# Patient Record
Sex: Female | Born: 1955
Health system: Southern US, Community
[De-identification: ages and names within clinical notes are randomized; demographics above are authoritative.]

## PROBLEM LIST (undated history)

## (undated) ENCOUNTER — Inpatient Hospital Stay (HOSPITAL_COMMUNITY): Payer: Self-pay

## (undated) DIAGNOSIS — E119 Type 2 diabetes mellitus without complications: Secondary | ICD-10-CM

## (undated) DIAGNOSIS — E079 Disorder of thyroid, unspecified: Secondary | ICD-10-CM

## (undated) DIAGNOSIS — E7800 Pure hypercholesterolemia, unspecified: Secondary | ICD-10-CM

## (undated) DIAGNOSIS — O99891 Other specified diseases and conditions complicating pregnancy: Secondary | ICD-10-CM

## (undated) DIAGNOSIS — E0590 Thyrotoxicosis, unspecified without thyrotoxic crisis or storm: Secondary | ICD-10-CM

## (undated) DIAGNOSIS — O9989 Other specified diseases and conditions complicating pregnancy, childbirth and the puerperium: Secondary | ICD-10-CM

## (undated) DIAGNOSIS — D259 Leiomyoma of uterus, unspecified: Secondary | ICD-10-CM

## (undated) HISTORY — DX: Type 2 diabetes mellitus without complications: E11.9

## (undated) HISTORY — DX: Leiomyoma of uterus, unspecified: D25.9

## (undated) HISTORY — PX: TUBAL LIGATION: SHX77

## (undated) HISTORY — PX: OTHER SURGICAL HISTORY: SHX169

## (undated) HISTORY — DX: Thyrotoxicosis, unspecified without thyrotoxic crisis or storm: E05.90

## (undated) HISTORY — PX: EYE SURGERY: SHX253

---

## 2001-01-07 ENCOUNTER — Encounter: Admission: RE | Admit: 2001-01-07 | Discharge: 2001-01-07 | Payer: Self-pay | Admitting: Family Medicine

## 2001-01-07 ENCOUNTER — Encounter: Payer: Self-pay | Admitting: Family Medicine

## 2001-03-05 ENCOUNTER — Encounter: Admission: RE | Admit: 2001-03-05 | Discharge: 2001-03-05 | Payer: Self-pay | Admitting: Internal Medicine

## 2004-06-25 ENCOUNTER — Inpatient Hospital Stay (HOSPITAL_COMMUNITY): Admission: AD | Admit: 2004-06-25 | Discharge: 2004-06-25 | Payer: Self-pay | Admitting: *Deleted

## 2004-07-04 ENCOUNTER — Ambulatory Visit: Payer: Self-pay | Admitting: Obstetrics and Gynecology

## 2004-07-04 ENCOUNTER — Encounter: Admission: RE | Admit: 2004-07-04 | Discharge: 2004-07-04 | Payer: Self-pay | Admitting: Obstetrics and Gynecology

## 2004-08-01 ENCOUNTER — Emergency Department (HOSPITAL_COMMUNITY): Admission: EM | Admit: 2004-08-01 | Discharge: 2004-08-01 | Payer: Self-pay | Admitting: Family Medicine

## 2004-08-29 ENCOUNTER — Ambulatory Visit: Payer: Self-pay | Admitting: Obstetrics and Gynecology

## 2004-09-03 ENCOUNTER — Ambulatory Visit: Payer: Self-pay | Admitting: Internal Medicine

## 2004-09-03 ENCOUNTER — Ambulatory Visit (HOSPITAL_COMMUNITY): Admission: RE | Admit: 2004-09-03 | Discharge: 2004-09-03 | Payer: Self-pay | Admitting: Internal Medicine

## 2004-09-16 ENCOUNTER — Ambulatory Visit: Payer: Self-pay | Admitting: Internal Medicine

## 2004-09-18 ENCOUNTER — Ambulatory Visit: Payer: Self-pay | Admitting: Internal Medicine

## 2008-01-31 ENCOUNTER — Inpatient Hospital Stay (HOSPITAL_COMMUNITY): Admission: AD | Admit: 2008-01-31 | Discharge: 2008-01-31 | Payer: Self-pay | Admitting: Obstetrics & Gynecology

## 2008-02-04 ENCOUNTER — Encounter: Payer: Self-pay | Admitting: Internal Medicine

## 2008-02-04 ENCOUNTER — Ambulatory Visit: Payer: Self-pay | Admitting: Internal Medicine

## 2008-02-04 DIAGNOSIS — E119 Type 2 diabetes mellitus without complications: Secondary | ICD-10-CM | POA: Insufficient documentation

## 2008-02-04 DIAGNOSIS — R109 Unspecified abdominal pain: Secondary | ICD-10-CM | POA: Insufficient documentation

## 2008-02-04 DIAGNOSIS — N644 Mastodynia: Secondary | ICD-10-CM

## 2008-02-04 LAB — CONVERTED CEMR LAB
AST: 10 units/L (ref 0–37)
Alkaline Phosphatase: 54 units/L (ref 39–117)
BUN: 18 mg/dL (ref 6–23)
Basophils Relative: 0 % (ref 0–1)
Creatinine, Ser: 0.72 mg/dL (ref 0.40–1.20)
Eosinophils Absolute: 0.3 10*3/uL (ref 0.0–0.7)
Eosinophils Relative: 4 % (ref 0–5)
MCHC: 32.5 g/dL (ref 30.0–36.0)
MCV: 80.7 fL (ref 78.0–100.0)
Monocytes Relative: 7 % (ref 3–12)
Neutrophils Relative %: 60 % (ref 43–77)
Potassium: 4.2 meq/L (ref 3.5–5.3)
RBC: 4.77 M/uL (ref 3.87–5.11)
Total Bilirubin: 0.3 mg/dL (ref 0.3–1.2)

## 2008-02-07 DIAGNOSIS — E785 Hyperlipidemia, unspecified: Secondary | ICD-10-CM | POA: Insufficient documentation

## 2008-02-11 ENCOUNTER — Encounter: Admission: RE | Admit: 2008-02-11 | Discharge: 2008-02-11 | Payer: Self-pay | Admitting: Internal Medicine

## 2008-02-15 ENCOUNTER — Telehealth: Payer: Self-pay | Admitting: *Deleted

## 2008-02-18 ENCOUNTER — Ambulatory Visit: Payer: Self-pay | Admitting: Infectious Diseases

## 2008-02-18 ENCOUNTER — Encounter: Payer: Self-pay | Admitting: Internal Medicine

## 2008-02-18 DIAGNOSIS — R300 Dysuria: Secondary | ICD-10-CM | POA: Insufficient documentation

## 2008-02-18 LAB — CONVERTED CEMR LAB
Creatinine, Urine: 106.7 mg/dL
Microalb Creat Ratio: 6.4 mg/g (ref 0.0–30.0)
Nitrite: NEGATIVE
Specific Gravity, Urine: 1.027 (ref 1.005–1.03)
Urobilinogen, UA: 0.2 (ref 0.0–1.0)

## 2008-02-23 ENCOUNTER — Ambulatory Visit: Payer: Self-pay | Admitting: Obstetrics & Gynecology

## 2008-02-23 ENCOUNTER — Encounter: Payer: Self-pay | Admitting: Obstetrics and Gynecology

## 2008-03-01 ENCOUNTER — Ambulatory Visit (HOSPITAL_COMMUNITY): Admission: RE | Admit: 2008-03-01 | Discharge: 2008-03-01 | Payer: Self-pay | Admitting: Obstetrics & Gynecology

## 2008-03-02 ENCOUNTER — Ambulatory Visit: Payer: Self-pay | Admitting: *Deleted

## 2008-03-02 ENCOUNTER — Encounter: Payer: Self-pay | Admitting: Internal Medicine

## 2008-03-02 LAB — CONVERTED CEMR LAB

## 2008-03-03 LAB — CONVERTED CEMR LAB
Cholesterol: 212 mg/dL — ABNORMAL HIGH (ref 0–200)
Total CHOL/HDL Ratio: 4.8
Triglycerides: 143 mg/dL (ref <150)
VLDL: 29 mg/dL (ref 0–40)

## 2008-03-23 ENCOUNTER — Ambulatory Visit: Payer: Self-pay | Admitting: Obstetrics and Gynecology

## 2008-04-10 ENCOUNTER — Ambulatory Visit: Payer: Self-pay | Admitting: Internal Medicine

## 2008-04-10 ENCOUNTER — Encounter: Payer: Self-pay | Admitting: Internal Medicine

## 2008-04-10 ENCOUNTER — Ambulatory Visit (HOSPITAL_COMMUNITY): Admission: RE | Admit: 2008-04-10 | Discharge: 2008-04-10 | Payer: Self-pay | Admitting: Internal Medicine

## 2008-04-10 DIAGNOSIS — R946 Abnormal results of thyroid function studies: Secondary | ICD-10-CM

## 2008-04-11 ENCOUNTER — Telehealth: Payer: Self-pay | Admitting: Internal Medicine

## 2008-04-13 ENCOUNTER — Telehealth: Payer: Self-pay | Admitting: *Deleted

## 2008-04-18 ENCOUNTER — Ambulatory Visit: Payer: Self-pay | Admitting: Internal Medicine

## 2008-04-25 ENCOUNTER — Ambulatory Visit: Payer: Self-pay | Admitting: Internal Medicine

## 2008-04-25 LAB — CONVERTED CEMR LAB

## 2008-05-16 ENCOUNTER — Encounter (INDEPENDENT_AMBULATORY_CARE_PROVIDER_SITE_OTHER): Payer: Self-pay | Admitting: *Deleted

## 2008-05-16 ENCOUNTER — Ambulatory Visit: Payer: Self-pay | Admitting: Internal Medicine

## 2008-05-16 ENCOUNTER — Encounter: Payer: Self-pay | Admitting: Internal Medicine

## 2008-05-16 DIAGNOSIS — M799 Soft tissue disorder, unspecified: Secondary | ICD-10-CM | POA: Insufficient documentation

## 2008-05-16 DIAGNOSIS — M79609 Pain in unspecified limb: Secondary | ICD-10-CM

## 2008-05-16 LAB — CONVERTED CEMR LAB: Blood Glucose, Fingerstick: 173

## 2008-05-26 ENCOUNTER — Ambulatory Visit: Payer: Self-pay | Admitting: *Deleted

## 2008-05-26 DIAGNOSIS — D259 Leiomyoma of uterus, unspecified: Secondary | ICD-10-CM

## 2008-06-07 ENCOUNTER — Ambulatory Visit: Payer: Self-pay | Admitting: Obstetrics & Gynecology

## 2008-08-16 ENCOUNTER — Telehealth: Payer: Self-pay | Admitting: *Deleted

## 2008-08-17 ENCOUNTER — Telehealth: Payer: Self-pay | Admitting: *Deleted

## 2009-10-09 ENCOUNTER — Encounter: Payer: Self-pay | Admitting: *Deleted

## 2010-08-11 LAB — CONVERTED CEMR LAB
ALT: 12 units/L (ref 0–35)
AST: 11 units/L (ref 0–37)
Alkaline Phosphatase: 65 units/L (ref 39–117)
BUN: 19 mg/dL (ref 6–23)
Basophils Absolute: 0 10*3/uL (ref 0.0–0.1)
Basophils Relative: 0 % (ref 0–1)
Calcium: 9.7 mg/dL (ref 8.4–10.5)
Chloride: 101 meq/L (ref 96–112)
Creatinine, Ser: 0.7 mg/dL (ref 0.40–1.20)
Eosinophils Absolute: 0.2 10*3/uL (ref 0.0–0.7)
Hemoglobin: 13.1 g/dL (ref 12.0–15.0)
Hgb A1c MFr Bld: 9.4 % — ABNORMAL HIGH (ref 4.6–6.1)
MCHC: 33.1 g/dL (ref 30.0–36.0)
MCV: 80.8 fL (ref 78.0–100.0)
Monocytes Absolute: 0.5 10*3/uL (ref 0.1–1.0)
Neutro Abs: 4.4 10*3/uL (ref 1.7–7.7)
Neutrophils Relative %: 60 % (ref 43–77)
RDW: 14 % (ref 11.5–15.5)

## 2010-08-13 NOTE — Letter (Signed)
Summary: 2nd recall letter saved in error//kg  [facility]  [address]   [place], [place] [address]   Phone: [phone]  Fax: [phone]    10/09/2009   Rachel Dougherty [address] [place], [place]  [address]   Dear  Ms. Rich Brave Helbert,  To help [place] provide the highest quality medical care, [facility] uses a sophisticated computer system to track our patient records.  During a review of your record, it appears that you are due for a return visit soon.   Please call our office at your earliest convenience so that we may schedule an appointment.  Thank you,   Appointment Secretary

## 2010-08-13 NOTE — Letter (Signed)
Summary: Recall Letter  [facility]  [address]   [place], [place] [address]   Phone: [phone]  Fax: [phone]    10/09/2009   Rachel Dougherty [address] [place], [place]  [address]   Dear  [name],  To help [place] provide the highest quality medical care, [facility] uses a sophisticated computer system to track our patient records.  During a review of your record, it appears that you are due for a return visit soon.   Please call our office at your earliest convenience so that we may schedule an appointment.  Thank you,  Cynda Familia [facility])  Appended Document: Recall Letter was unable to reach pt by phone above letter mailed

## 2010-11-26 NOTE — Group Therapy Note (Signed)
Rachel Dougherty, Rachel Dougherty                 ACCOUNT NO.:  [id number]   MEDICAL RECORD NO.:  [id number]          PATIENT TYPE:  WOC   LOCATION:  [facility]                   FACILITY:  [facility]   PHYSICIAN:  Caren Griffins, CNM       DATE OF BIRTH:  09/10/1955   DATE OF SERVICE:  02/23/2008                                  CLINIC NOTE   REASON FOR VISIT:  Abdominal pain.   HISTORY OF PRESENT ILLNESS:  This is a 55 year old G6, P 5-0-1-5 who is  sent here following an MAU visit 3 weeks ago for abdominal pain.  At  that visit she had an exam that showed some mild left and right lower  abdominal tenderness and otherwise unremarkable exam.  She has had  chronic lower abdominal pain for many years and was evaluated by Dr.  Okey Dupre at this clinic on August 29, 2004 and her abdominal pelvic pain  was thought to be secondary to pelvic adhesions and possible  adenomyosis.  Of note, she has 5 cesarean sections.  Last intercourse  was 23 years ago.  She has recently returned from [place].  She states  that she was expecting that she could have a hysterectomy after she had  menopause.   Abnormal uterine bleeding has been present for about a year.  Up until  age 80 she had regular periods.  For the past year she describes a  pattern of going sometimes 3 months with no bleeding and then having a  heavy long bleed with clots.  She has also had periods of bleeding up to  3 times a month, typically for about 15 days and 2 other episodes of  bleeding for 2 days.  Her last bleed was February 08, 2008, and she bled for  10 days.  She has worse lower abdominal pain during the bleeding.  Her  past evaluation with [place] was pelvic ultrasound in 2005 where she had a  question of a small endometrial polyp.  She may have had another  ultrasound since then done in [place], and believes that she has fibroids.   She is a type 2 diabetic and not well controlled.  However, she was seen  by [facility] and medications were  adjusted on February 09, 2008.   PHYSICAL EXAM:  CONSTITUTIONAL:  She is in no distress walking.  VITAL SIGNS:  Temperature 97.8, pulse 93, BP 116/72, weight 158.  Abbreviated physical exam.  ABDOMEN:  Abdomen is soft and flat.  She has diffuse lower abdominal  tenderness, no guarding.  No rebound.  She also has vertical and  transverse scarring of her abdomen.  PELVIC EXAM:  External genitalia, mildly atrophic.  She has a female  circumcision.  The introitus admits 2 fingers.  Vagina is also atrophic,  pale with few rugae.  Cervix is posterior without lesions.  Pap smear  done.  There is some scant amount of brown old blood from os during Pap.  Bimanual, the uterus is firm, irregular and enlarged to about 12 weeks'  size and is anteverted.   ASSESSMENT:  Perimenopausal bleeding,  possible fibroids, abdominal pain,  may be due to the adhesions due to having 5 C-sections or she may have  fibroids.   PLAN:  Today will get a CBC, TSH and she will be scheduled for pelvic  ultrasound.  This case is discussed with Dr. Marice Potter.           ______________________________  Caren Griffins, CNM     DP/MEDQ  D:  02/23/2008  T:  02/23/2008  Job:  [id number]

## 2010-11-26 NOTE — Group Therapy Note (Signed)
NAME:  Rachel Dougherty, Rachel Dougherty NO.:  [id number]   MEDICAL RECORD NO.:  [id number]          PATIENT TYPE:  WOC   LOCATION:  [facility]                   FACILITY[facility]  [facility]   PHYSICIAN:  Argentina Donovan, MD        DATE OF BIRTH:  09/10/1955   DATE OF SERVICE:  03/23/2008                                  CLINIC NOTE   The patient is a 55 year old, [place], Arabic-speaking woman who has a  sister here that we delivered a baby for and so easy to communicate with  that way, but we have had her in and talked to her by way of the  translating phone.  Please refer to her chart for more detailed history.  She is gravida 6, para 5-0-1-5, who has had chronic pelvic pain and  heavy bleeding prior to the onset of her menopause, but she still has  __________ 5 cesarean sections and tubal ligation probably have left her  with some marked adhesions in the pelvis, so we are going to schedule  her for a LAVH, possible abdominal hysterectomy.  Ultrasound of the  uterus is normal size and shape with a 5-mm endometrium; no pathology  was noted.  She has returned from [place] especially to have the surgery  and is a diabetic, not well-controlled, and on her last visit, we  checked her thyroid, and she had a TSH of 0.015, so we are starting her  on 100 mcg of Synthroid and referring her to the [facility] for  medical clearance.  I have told her we would definitely not undertake  this surgery if we do not have complete medical clearance and her  diabetes and thyroid under complete control.  She seems to understand  that.  We have also discussed the possibility that she could end up with  severe bowel injury, possible colostomy or urinary tract injury because  of all the scarring that may be present there.  With that in mind,  probably put in ureteral catheters prior to the surgery, although I will  discuss with Dr. Mia Creek.   The diagnosis is chronic persistent pelvic pain.     ______________________________  Argentina Donovan, MD     PR/MEDQ  D:  03/23/2008  T:  03/24/2008  Job:  [id number]

## 2010-11-29 NOTE — Group Therapy Note (Signed)
NAMEDERRIAN, RODAK NO.:  [id number]   MEDICAL RECORD NO.:  [id number]          PATIENT TYPE:  WOC   LOCATION:  [facility]                   [facility]ILITY[facility]  [facility]   PHYSICIAN:  Argentina Donovan, MD        DATE OF BIRTH:  09/10/1955   DATE OF SERVICE:  08/29/2004                                    CLINIC NOTE   This is a 55 year old Arab female gravida 6, para 5-0-1-5 with history of  five cesarean sections who comes in with multiple complaints.  The only GYN  complaint is lower abdominal pain which she has had for many years and  normal ultrasound with marked abdominal scarring vertical and transverse.  The abdomen is soft, flat, tender to palpation without rebound or guarding  and feel that most of her pelvic pain is due to secondary adhesions,  although she may have some adenomyosis as it seems to be increased with her  periods.  Trying to buy time with this diabetic patient because hopefully  the pain will be more tolerable after menopause which should come in several  years.  She is also diabetic.  She has been out of her medication for  several days.  We are going to renew it and try to get her an appointment  with medical clinic at [facility].  Her other complaint is in the  intracostal area along the whole right side and with many areas of point  tenderness.  Mammogram and breast examinations in the past and present have  been fine.  It was suggested anti-inflammatories on a regular basis and  possible use of Capsaicin cream to relieve that pain.   PHYSICAL EXAMINATION:  HEART:  No murmur.  Normal sinus rhythm.  LUNGS:  Clear to auscultation and percussion.  NECK:  Supple with no masses.  HEENT:  Within normal limits.  ABDOMEN:  Soft, flat, tender to deep palpation in the lower abdomen without  rebound or guarding and no organomegaly.  No CVA tenderness.  EXTREMITIES:  Negative.  PELVIC:  External genitalia __________ examination was deferred.  BREASTS:   Symmetric with no dominant masses and no nipple discharge.   IMPRESSION:  1.  Chronic lower abdominal pelvic pain probably secondary to pelvic      adhesions and possible adenomyosis.  2.  Mastodynia with additional probable intracostal neuritis.  3.  Diabetes mellitus  in which the patient has been out of her medication      and feels that she is out of control.  We are going to refer her to the      medical clinic at [facility].      PR/MEDQ  D:  08/29/2004  T:  08/30/2004  Job:  [id number]

## 2010-11-29 NOTE — Group Therapy Note (Signed)
NAMESAMEEHA, ROCKEFELLER NO.:  [id number]   MEDICAL RECORD NO.:  [id number]          PATIENT TYPE:  WOC   [facility]ATION[facility]  [facility]                   [facility]   PHYSICIAN:  Argentina Donovan, MD        DATE OF BIRTH:  09/10/1955   DATE OF SERVICE:  07/04/2004                                    CLINIC NOTE   REASON FOR VISIT:  The patient is a 55 year old Arab female who is a gravida  6 para 5-0-1-5 with history of five cesarean sections who over the past year  has had low abdominal pain, especially when she has a period.  Ultrasound of  her uterus and ovaries were completely normal.  On examination, her abdomen  was soft flat, tender in the area of the cesarean scars, but without  guarding and rebound.  External genitalia is within normal limits.  The  patient has a female circumcision.  The vagina is clean, slight loss of  rugations, with a clean cervix.  The uterus and ovaries could not be well  palpated but the patient did not exhibit much pain while doing this.   We have discussed the possibility of surgery which her, which she was told  in [place] she needed.  I think most of her problem probably is due to  scarring that has occurred from her previous surgeries.  On the other hand,  with the increased pain with her periods, she probably has some adenomyosis,  but being this close to menopause I have tried to dissuade her from desiring  surgery as I do not think she will be happy with the results and get the  dramatic pain relief that she expects.   In addition to this, the patient has some breast tenderness and had a  mammogram today.  The breast tenderness is in the left upper quadrant of the  left breast, and examination in the MAU showed that she had a normal breast  examination.  We will await her results and call her sister who speaks  English with the report.   IMPRESSION:  1.  Mastodynia.  2.  Chronic pelvic pain.      PR/MEDQ  D:  07/04/2004  T:   07/05/2004  Job:  [id number]

## 2011-04-11 LAB — WET PREP, GENITAL
Trich, Wet Prep: NONE SEEN
Yeast Wet Prep HPF POC: NONE SEEN

## 2011-04-11 LAB — URINALYSIS, ROUTINE W REFLEX MICROSCOPIC
Bilirubin Urine: NEGATIVE
Glucose, UA: >1000 — AB
Hgb urine dipstick: NEGATIVE
Ketones, ur: NEGATIVE
Leukocytes, UA: NEGATIVE
Protein, ur: NEGATIVE
pH: 5.5

## 2011-04-11 LAB — POCT PREGNANCY, URINE: Operator id: 251141

## 2011-04-16 ENCOUNTER — Observation Stay (HOSPITAL_COMMUNITY)
Admission: EM | Admit: 2011-04-16 | Discharge: 2011-04-16 | Disposition: A | Discharge status: Home / Self Care | Payer: Self-pay | Attending: Emergency Medicine | Admitting: Emergency Medicine

## 2011-04-16 DIAGNOSIS — E119 Type 2 diabetes mellitus without complications: Principal | ICD-10-CM | POA: Insufficient documentation

## 2011-04-16 DIAGNOSIS — R5381 Other malaise: Secondary | ICD-10-CM | POA: Insufficient documentation

## 2011-04-16 DIAGNOSIS — R300 Dysuria: Secondary | ICD-10-CM | POA: Insufficient documentation

## 2011-04-16 DIAGNOSIS — R5383 Other fatigue: Secondary | ICD-10-CM | POA: Insufficient documentation

## 2011-04-16 DIAGNOSIS — R109 Unspecified abdominal pain: Secondary | ICD-10-CM | POA: Insufficient documentation

## 2011-04-16 LAB — URINALYSIS, ROUTINE W REFLEX MICROSCOPIC
Bilirubin Urine: NEGATIVE
Glucose, UA: >1000 mg/dL — AB
Ketones, ur: NEGATIVE mg/dL
Nitrite: NEGATIVE
Protein, ur: NEGATIVE mg/dL
pH: 6.5 (ref 5.0–8.0)

## 2011-04-16 LAB — POCT I-STAT, CHEM 8
Calcium, Ion: 1.24 mmol/L (ref 1.12–1.32)
Calcium, Ion: 1.24 mmol/L (ref 1.12–1.32)
Chloride: 104 mEq/L (ref 96–112)
Glucose, Bld: 423 mg/dL — ABNORMAL HIGH (ref 70–99)
Glucose, Bld: 221 mg/dL — ABNORMAL HIGH (ref 70–99)
HCT: 37 % (ref 36.0–46.0)
HCT: 35 % — ABNORMAL LOW (ref 36.0–46.0)
Hemoglobin: 11.9 g/dL — ABNORMAL LOW (ref 12.0–15.0)
TCO2: 23 mmol/L (ref 0–100)

## 2011-04-16 LAB — GLUCOSE, CAPILLARY
Glucose-Capillary: 407 mg/dL — ABNORMAL HIGH (ref 70–99)
Glucose-Capillary: 306 mg/dL — ABNORMAL HIGH (ref 70–99)
Glucose-Capillary: 254 mg/dL — ABNORMAL HIGH (ref 70–99)
Glucose-Capillary: 250 mg/dL — ABNORMAL HIGH (ref 70–99)
Glucose-Capillary: 222 mg/dL — ABNORMAL HIGH (ref 70–99)

## 2011-04-16 LAB — URINE MICROSCOPIC-ADD ON

## 2011-04-17 LAB — URINE CULTURE

## 2011-04-23 ENCOUNTER — Inpatient Hospital Stay (INDEPENDENT_AMBULATORY_CARE_PROVIDER_SITE_OTHER)
Admission: RE | Admit: 2011-04-23 | Discharge: 2011-04-23 | Disposition: A | Discharge status: Home / Self Care | Payer: Self-pay | Source: Ambulatory Visit | Attending: Family Medicine | Admitting: Family Medicine

## 2011-04-23 DIAGNOSIS — M7710 Lateral epicondylitis, unspecified elbow: Secondary | ICD-10-CM

## 2011-04-23 DIAGNOSIS — E119 Type 2 diabetes mellitus without complications: Secondary | ICD-10-CM

## 2011-04-23 DIAGNOSIS — R51 Headache: Secondary | ICD-10-CM

## 2011-05-12 ENCOUNTER — Inpatient Hospital Stay (INDEPENDENT_AMBULATORY_CARE_PROVIDER_SITE_OTHER)
Admission: RE | Admit: 2011-05-12 | Discharge: 2011-05-12 | Disposition: A | Discharge status: Home / Self Care | Payer: Self-pay | Source: Ambulatory Visit | Attending: Emergency Medicine | Admitting: Emergency Medicine

## 2011-05-12 DIAGNOSIS — E119 Type 2 diabetes mellitus without complications: Secondary | ICD-10-CM

## 2011-05-12 LAB — GLUCOSE, CAPILLARY: Glucose-Capillary: 311 mg/dL — ABNORMAL HIGH (ref 70–99)

## 2011-05-22 ENCOUNTER — Ambulatory Visit (INDEPENDENT_AMBULATORY_CARE_PROVIDER_SITE_OTHER): Payer: Self-pay | Admitting: Ophthalmology

## 2011-05-22 ENCOUNTER — Encounter: Payer: Self-pay | Admitting: Ophthalmology

## 2011-05-22 VITALS — BP 120/74 | HR 79 | Temp 96.5°F | Wt 160.6 lb

## 2011-05-22 DIAGNOSIS — R946 Abnormal results of thyroid function studies: Secondary | ICD-10-CM

## 2011-05-22 DIAGNOSIS — E0590 Thyrotoxicosis, unspecified without thyrotoxic crisis or storm: Secondary | ICD-10-CM

## 2011-05-22 DIAGNOSIS — E785 Hyperlipidemia, unspecified: Secondary | ICD-10-CM

## 2011-05-22 DIAGNOSIS — E119 Type 2 diabetes mellitus without complications: Secondary | ICD-10-CM

## 2011-05-22 DIAGNOSIS — R109 Unspecified abdominal pain: Secondary | ICD-10-CM

## 2011-05-22 LAB — GLUCOSE, CAPILLARY: Glucose-Capillary: 163 mg/dL — ABNORMAL HIGH (ref 70–99)

## 2011-05-22 LAB — LIPID PANEL
LDL Cholesterol: 180 mg/dL — ABNORMAL HIGH (ref 0–99)
Total CHOL/HDL Ratio: 6.3 Ratio
VLDL: 33 mg/dL (ref 0–40)

## 2011-05-22 LAB — POCT GLYCOSYLATED HEMOGLOBIN (HGB A1C): Hemoglobin A1C: 12

## 2011-05-22 MED ORDER — INSULIN NPH ISOPHANE & REGULAR (70-30) 100 UNIT/ML ~~LOC~~ SUSP: SUBCUTANEOUS | Status: DC

## 2011-05-22 MED ORDER — INSULIN ISOPHANE & REGULAR (50-50) 100 UNIT/ML ~~LOC~~ SUSP: SUBCUTANEOUS | Status: DC

## 2011-05-22 MED ORDER — ASPIRIN EC 81 MG PO TBEC: 81.0000 mg | DELAYED_RELEASE_TABLET | Freq: Every day | ORAL | Status: DC

## 2011-05-22 MED ORDER — PRAVASTATIN SODIUM 20 MG PO TABS: 20.0000 mg | ORAL_TABLET | Freq: Every evening | ORAL | Status: DC

## 2011-05-22 NOTE — Assessment & Plan Note (Signed)
Drew TSH today. Patient seems to be symptomatically hyperthyroid. Will consider medical treatment at next visit.

## 2011-05-22 NOTE — Patient Instructions (Signed)
Please monitor blood sugar twice a day for next two weeks and bring in monitor. Ask to make appt with diabetes educator. Call us if she is having sugars below 70 or feeling weak, shaky or nauseous.

## 2011-05-22 NOTE — Assessment & Plan Note (Signed)
Will increase 70/30 to 30 units in AM and 18 units at night. Advised patient to call if she is having symptoms of hypoglycemia. Will also meet with Lupita Leash Plyler to refresh her lifestyle modification plan. Will not use metformin due to history of diarrhea with its use.

## 2011-05-22 NOTE — Assessment & Plan Note (Signed)
Patient has not been taking statin for more than 1 year. Counseled her that she is at risk for a heart attack or stroke because of her diabetes and high cholesterol and she will need to take a baby aspirin and cholesterol medication long term to decrease her risk. Drew non-fasting lipid panel today and started her on pravastatin. May need to increase dose.

## 2011-05-22 NOTE — Progress Notes (Signed)
Subjective:   Patient ID: Rachel Dougherty female   DOB: 09/10/1955 55 y.o.   MRN: [id number]  HPI: [name] is a 55 y.o. woman with a history of uterine fibroids, c-sections and abdominal pain who presents for return to primary care. Patient had orange card in the past but is now expired. Patient does not speak English and was interviewed with the aid of her sister, Rachel Dougherty and an Investment banker, corporate.  Abdominal Pain-  Has had pain for the last 3-4 years but has increased in past year. Pain is 7/8 out of 10. Pain comes and goes, about 2-3 days per week and occurs with associated low back pain. Feels pain on right middle abdomen and it radiates to periumbilical area. Has occasional constipation, was diagnosed with IBS in [place]. Normally moves bowels once a day but sometimes as infrequently as once every 3 days. Doesn't  take anything for irritable bowel. Has more pain after eating. Also complains of gas. Was referred to Dr. Okey Dupre of gynecology in 2009 and he planned to do hysterectomy but per patient he wanted to wait until she was post-menopausal. She has not had menses in past 1.5 years.  Diabetes- didn't take insulin for 1 week after arrival back in [place] states in late September, until she presented to emergency room and got prescription on 04/16/11. She is currently taking 23U in AM/15 Units PM. Was taking 30 units in the morning in the past. Patient brought in her glucometer today and her sugars are about 200 in the AM and around 300 in evening. Breakfast - milk/tea and crackers at 8am, Lunch around noon and dinner aound 4-5pm. No rice or potatoes or sweets, about 2 pita breads per day, whole wheat. Uses splenda in her tea and has sugar free drinks similar to crystal light. Also has corn bread occasionally. Has seen our diabetes educator in the past. Used to take metformin 1000mg  as well, although she says that this gave her diarrhea all the time. Knows symptoms of hypoglycemia, hasn't had any episodes  in past 4-5 months. Episodes occurred in setting of not having breakfast.  Thyroid- was on medication for hyperthyroid prescribed in [place], hasn't taken in past 5-6 months. Feels subjectively hot, has some symptoms of shaking. Reports hair loss and weight loss recently. Also sweatiness. Feels like heart is racing occasionally. Weight is essentially stable from visits in 2009- all visits fall between 160 and 165 lbs.  Cataract- diagnosed in [place]  Past Medical History  Diagnosis Date  . Diabetes mellitus type II   . Hyperthyroidism   . Fibroid uterus    Current Outpatient Prescriptions  Medication Sig Dispense Refill  . insulin NPH-insulin regular (NOVOLIN 50/50) (50-50) 100 UNIT/ML injection 30 Units before breakfast and 18 units before dinner  10 mL  4  . aspirin EC 81 MG tablet Take 1 tablet (81 mg total) by mouth daily.  150 tablet  2  . pravastatin (PRAVACHOL) 20 MG tablet Take 1 tablet (20 mg total) by mouth every evening.  30 tablet  11   No family history on file. History   Social History  . Marital Status: Married    Spouse Name: N/A    Number of Children: N/A  . Years of Education: N/A   Social History Main Topics  . Smoking status: Never Smoker   . Smokeless tobacco: Not on file  . Alcohol Use: No  . Drug Use: No  . Sexually Active: Not on file  Other Topics Concern  . Not on file   Social History Narrative  . No narrative on file   Review of Systems: HEENT: Reports blurry vision. Respiratory: Denies SOB. Cardiovascular: Denies chest painand leg swelling. Reports palpitations  Gastrointestinal: Reports abdominal pain and constipation   Objective:  Physical Exam: Filed Vitals:   05/22/11 0938  BP: 120/74  Pulse: 79  Temp: 96.5 F (35.8 C)  TempSrc: Oral  Weight: 160 lb 9.6 oz (72.848 kg)   Constitutional: Vital signs reviewed.  Patient is an obese veiled [place] woman with her sister and nephew Eyes: PERRL, EOMI, conjunctivae normal, No scleral  icterus.  Cardiovascular: RRR, S1 normal, S2 normal, no MRG, pulses symmetric and intact bilaterally Pulmonary/Chest: CTAB, no wheezes, rales, or rhonchi Abdominal: Soft. Mildly tender to palpation of right abdomen, non-distended, bowel sounds are normal. GU: no CVA tenderness Musculoskeletal: No joint deformities, erythema, or stiffness, ROM full and no nontender  Skin: Warm, dry and intact. No rash, cyanosis, or clubbing.  Psychiatric: Normal mood and affect. speech and behavior is normal. Judgment and thought content normal. Cognition and memory are normal.   Assessment & Plan:

## 2011-05-22 NOTE — Assessment & Plan Note (Signed)
Referred back to Dr. Okey Dupre of gynecology since he has seen her in the past for hysterectomy. Patient may have other causes of her abdominal pain since she complains of post-prandial pain and gas.

## 2011-05-23 ENCOUNTER — Telehealth: Payer: Self-pay | Admitting: *Deleted

## 2011-05-23 LAB — MICROALBUMIN / CREATININE URINE RATIO: Microalb, Ur: 0.5 mg/dL (ref 0.00–1.89)

## 2011-05-23 LAB — TSH: TSH: 0.754 u[IU]/mL (ref 0.350–4.500)

## 2011-05-23 NOTE — Telephone Encounter (Signed)
Family member called about rx that had not been called to [facility]; rx for pt.'s thyroid I talked to Dr. Maisie Fus; lab is normal and pt does not need medication at this time; will continue to monitor. I inform her of this; she understood.

## 2011-05-23 NOTE — Progress Notes (Signed)
Addended by: Angelina Ok F on: 05/23/2011 10:24 AM   Modules accepted: Orders

## 2011-06-02 ENCOUNTER — Encounter (HOSPITAL_COMMUNITY): Payer: Self-pay | Admitting: *Deleted

## 2011-06-02 ENCOUNTER — Inpatient Hospital Stay (HOSPITAL_COMMUNITY)
Admission: AD | Admit: 2011-06-02 | Discharge: 2011-06-02 | Disposition: A | Discharge status: Home / Self Care | Payer: Self-pay | Source: Ambulatory Visit | Attending: Obstetrics & Gynecology | Admitting: Obstetrics & Gynecology

## 2011-06-02 ENCOUNTER — Inpatient Hospital Stay (HOSPITAL_COMMUNITY): Payer: Self-pay

## 2011-06-02 DIAGNOSIS — Z780 Asymptomatic menopausal state: Secondary | ICD-10-CM | POA: Insufficient documentation

## 2011-06-02 DIAGNOSIS — R109 Unspecified abdominal pain: Secondary | ICD-10-CM | POA: Insufficient documentation

## 2011-06-02 DIAGNOSIS — D259 Leiomyoma of uterus, unspecified: Secondary | ICD-10-CM | POA: Insufficient documentation

## 2011-06-02 DIAGNOSIS — E785 Hyperlipidemia, unspecified: Secondary | ICD-10-CM

## 2011-06-02 HISTORY — DX: Pure hypercholesterolemia, unspecified: E78.00

## 2011-06-02 HISTORY — DX: Other specified diseases and conditions complicating pregnancy: O99.891

## 2011-06-02 HISTORY — DX: Other specified diseases and conditions complicating pregnancy, childbirth and the puerperium: O99.89

## 2011-06-02 LAB — DIFFERENTIAL
Basophils Absolute: 0 10*3/uL (ref 0.0–0.1)
Basophils Relative: 1 % (ref 0–1)
Lymphocytes Relative: 35 % (ref 12–46)
Neutro Abs: 3.1 10*3/uL (ref 1.7–7.7)
Neutrophils Relative %: 54 % (ref 43–77)

## 2011-06-02 LAB — CBC
HCT: 40.3 % (ref 36.0–46.0)
Platelets: 279 10*3/uL (ref 150–400)
RDW: 13.4 % (ref 11.5–15.5)
WBC: 5.7 10*3/uL (ref 4.0–10.5)

## 2011-06-02 LAB — URINALYSIS, ROUTINE W REFLEX MICROSCOPIC
Leukocytes, UA: NEGATIVE
Nitrite: NEGATIVE
Specific Gravity, Urine: >1.03 — ABNORMAL HIGH (ref 1.005–1.030)
Urobilinogen, UA: 0.2 mg/dL (ref 0.0–1.0)
pH: 5 (ref 5.0–8.0)

## 2011-06-02 MED ORDER — OXYCODONE-ACETAMINOPHEN 5-325 MG PO TABS
1.0000 | ORAL_TABLET | Freq: Once | ORAL | Status: AC
  Administered 2011-06-02: 1 via ORAL
  Filled 2011-06-02: qty 1

## 2011-06-02 NOTE — ED Provider Notes (Signed)
History     Chief Complaint  Patient presents with  . Abdominal Pain   HPINemat E Blomquist 55 y.o. presents with abdominal pain.  Former patient of Dr. Erich Montane' last seen several years ago.  Plan at that time was a hysterectomy for ? Fibroids.  Went back to [place], did not have surgery.  She was fearful of surgery per the interpreter.   Last menstrual cycle 1 year ago. Denies vaginal bleeding.  Has appt in the GYN CLINIC for 12/19 but did not feel she could wait because of pain.  Reports pain is in the lower abdomen.  Also has this pain when she urinates.  Denies other UTI sxs.  Rates pain as 7/10.  Allergic to Penicillin.  She has not taken anything for the pain.  Denies nausea and vomiting.  Not sexually active for years, husband is deceased.   Past Medical History  Diagnosis Date  . Diabetes mellitus type II   . Hyperthyroidism   . Fibroid uterus   . Hypercholesteremia   . Maternal blood transfusion     after miscarriage    Past Surgical History  Procedure Date  . Repeat cesarean section     5 C-section  . Tubal ligation   . Cesarean section   . Female circumcision     History reviewed. No pertinent family history.  History  Substance Use Topics  . Smoking status: Never Smoker   . Smokeless tobacco: Not on file  . Alcohol Use: No    Allergies:  Allergies  Allergen Reactions  . Penicillins Rash    Prescriptions prior to admission  Medication Sig Dispense Refill  . aspirin EC 81 MG tablet Take 81 mg by mouth daily.        . insulin NPH-insulin regular (NOVOLIN 70/30) (70-30) 100 UNIT/ML injection Inject 18-30 Units into the skin 2 (two) times daily with a meal. Injects 30 units before breakfast & 18 units before dinner       . pravastatin (PRAVACHOL) 20 MG tablet Take 20 mg by mouth every evening.        Marland Kitchen DISCONTD: aspirin EC 81 MG tablet Take 1 tablet (81 mg total) by mouth daily.  150 tablet  2  . DISCONTD: pravastatin (PRAVACHOL) 20 MG tablet Take 1 tablet (20 mg  total) by mouth every evening.  30 tablet  11    Review of Systems  Constitutional: Negative for fever.  HENT: Negative.   Cardiovascular: Negative.   Gastrointestinal: Positive for abdominal pain (across lower abdomen). Negative for nausea and vomiting.  Genitourinary: Positive for dysuria. Negative for urgency, frequency and hematuria.  Musculoskeletal: Positive for back pain (lower back bialterally).   Physical Exam   Blood pressure 119/81, pulse 77, temperature 98.9 F (37.2 C), temperature source Oral, resp. rate 16, last menstrual period 05/21/2010, SpO2 99.00%.  Physical Exam  Constitutional: She is oriented to person, place, and time. She appears well-developed and well-nourished. No distress.  HENT:  Head: Normocephalic.  Neck: Normal range of motion.  Cardiovascular: Normal rate.   Respiratory: Effort normal.  GI: Soft. She exhibits no distension and no mass. There is no tenderness. There is no rebound and no guarding.  Genitourinary: Uterus is enlarged (irregular ) and tender. Right adnexum displays no mass, no tenderness and no fullness. Left adnexum displays no mass, no tenderness and no fullness. No tenderness or bleeding around the vagina. No vaginal discharge found.       Female circumcision  Neurological: She is  alert and oriented to person, place, and time.  Skin: Skin is warm and dry.   Results for orders placed during the hospital encounter of 06/02/11 (from the past 24 hour(s))  URINALYSIS, ROUTINE W REFLEX MICROSCOPIC     Status: Abnormal   Collection Time   06/02/11  1:20 PM      Component Value Range   Color, Urine YELLOW  YELLOW    Appearance CLEAR  CLEAR    Specific Gravity, Urine >1.030 (*) 1.005 - 1.030    pH 5.0  5.0 - 8.0    Glucose, UA NEGATIVE  NEGATIVE (mg/dL)   Hgb urine dipstick NEGATIVE  NEGATIVE    Bilirubin Urine NEGATIVE  NEGATIVE    Ketones, ur 15 (*) NEGATIVE (mg/dL)   Protein, ur NEGATIVE  NEGATIVE (mg/dL)   Urobilinogen, UA 0.2   0.0 - 1.0 (mg/dL)   Nitrite NEGATIVE  NEGATIVE    Leukocytes, UA NEGATIVE  NEGATIVE   CBC     Status: Normal   Collection Time   06/02/11  2:06 PM      Component Value Range   WBC 5.7  4.0 - 10.5 (K/uL)   RBC 4.87  3.87 - 5.11 (MIL/uL)   Hemoglobin 13.2  12.0 - 15.0 (g/dL)   HCT 16.1  09.6 - 04.5 (%)   MCV 82.8  78.0 - 100.0 (fL)   MCH 27.1  26.0 - 34.0 (pg)   MCHC 32.8  30.0 - 36.0 (g/dL)   RDW 40.9  81.1 - 91.4 (%)   Platelets 279  150 - 400 (K/uL)  DIFFERENTIAL     Status: Normal   Collection Time   06/02/11  2:06 PM      Component Value Range   Neutrophils Relative 54  43 - 77 (%)   Neutro Abs 3.1  1.7 - 7.7 (K/uL)   Lymphocytes Relative 35  12 - 46 (%)   Lymphs Abs 2.0  0.7 - 4.0 (K/uL)   Monocytes Relative 7  3 - 12 (%)   Monocytes Absolute 0.4  0.1 - 1.0 (K/uL)   Eosinophils Relative 3  0 - 5 (%)   Eosinophils Absolute 0.2  0.0 - 0.7 (K/uL)   Basophils Relative 1  0 - 1 (%)   Basophils Absolute 0.0  0.0 - 0.1 (K/uL)        *RADIOLOGY REPORT*  Clinical Data: Pelvic pain. Fibroids. Post menopausal female with  LMP 1 year ago.  TRANSABDOMINAL AND TRANSVAGINAL ULTRASOUND OF PELVIS  Technique: Both transabdominal and transvaginal ultrasound  examinations of the pelvis were performed. Transabdominal  technique was performed for global imaging of the pelvis including  uterus, ovaries, adnexal regions, and pelvic cul-de-sac.  It was necessary to proceed with endovaginal exam following the  transabdominal exam to visualize the endometrium and ovaries.  Comparison: None.  Findings:  Uterus: 8.5 x 3.0 x 2.8 cm. Retroflexed. A single small  intramural fibroid is seen in the left posterior fundus measuring  1.3 cm in maximum diameter.  Endometrium: Double layer thickness measures 6 mm transvaginally.  Normal appearance.  Right ovary: 2.2 x 1.5 x 1.5 cm. Normal appearance.  Left ovary: 2.9 x 1.6 x 1.7 cm. Normal appearance.  Other Findings: No free fluid  IMPRESSION:    1. Retroflexed uterus with 1.3 cm left posterior fundal fibroid.  2. Endometrial thickness of 6 mm is within normal limits in the  absence of post menopausal bleeding.  3. Normal post menopausal size and appearance of both  ovaries.  Original Report Authenticated By: Danae Orleans, M.D.    MAU Course  Procedures  MDM  Percocet 5/325mg  Tab given in MAU/   Assessment and Plan  A:  Abdominal Pain/post menopausal      1.3cm posterior fundal fibroid  P: Encouraged to keep the appointment she has scheduled in the GYN CLINIC     May use Advil at home for pain.     If pain worsens after treatment, will need re-evaluation  Madaleine Simmon,EVE M 06/02/2011, 1:49 PM   Matt Holmes, NP 06/02/11 1642

## 2011-06-02 NOTE — Progress Notes (Signed)
Patient states she has not had periods in about one year. Has been a patient of Dr. Okey Dupre and had planned to remove the uterus when her periods stopped but she left the country. Now that she is back she is having a lot of abdominal pain. Has an appointment on 12-19 but is having too much pain.

## 2011-06-09 ENCOUNTER — Ambulatory Visit (INDEPENDENT_AMBULATORY_CARE_PROVIDER_SITE_OTHER): Payer: Self-pay | Admitting: Ophthalmology

## 2011-06-09 ENCOUNTER — Encounter: Payer: Self-pay | Admitting: Ophthalmology

## 2011-06-09 DIAGNOSIS — E119 Type 2 diabetes mellitus without complications: Secondary | ICD-10-CM

## 2011-06-09 DIAGNOSIS — R946 Abnormal results of thyroid function studies: Secondary | ICD-10-CM

## 2011-06-09 DIAGNOSIS — E1149 Type 2 diabetes mellitus with other diabetic neurological complication: Secondary | ICD-10-CM

## 2011-06-09 DIAGNOSIS — G909 Disorder of the autonomic nervous system, unspecified: Secondary | ICD-10-CM

## 2011-06-09 DIAGNOSIS — E1143 Type 2 diabetes mellitus with diabetic autonomic (poly)neuropathy: Secondary | ICD-10-CM

## 2011-06-09 DIAGNOSIS — E1142 Type 2 diabetes mellitus with diabetic polyneuropathy: Secondary | ICD-10-CM | POA: Insufficient documentation

## 2011-06-09 LAB — GLUCOSE, CAPILLARY: Glucose-Capillary: 301 mg/dL — ABNORMAL HIGH (ref 70–99)

## 2011-06-09 MED ORDER — INSULIN NPH ISOPHANE & REGULAR (70-30) 100 UNIT/ML ~~LOC~~ SUSP: SUBCUTANEOUS | Status: DC

## 2011-06-09 MED ORDER — TRAMADOL HCL 50 MG PO TABS: 50.0000 mg | ORAL_TABLET | Freq: Every evening | ORAL | Status: AC | PRN

## 2011-06-09 NOTE — Assessment & Plan Note (Signed)
Patient had not increased her dose of 70/30 as directed (30 in AM, 18 at night) so we will make these changes today and have her come back in 2 weeks for further changes in her diabetic regimen. Also made appt for her to see diabetic educator. She denies having excessive sugary drinks, breads, rice, potatoes, claims to eat only 2-3 small whole wheat pita breads each day. Patient also inquires about metformin at the end of visit. She had taken it in the past but she described having diarrhea so I did not start her on it at her last visit. Can discuss at next visit.

## 2011-06-09 NOTE — Patient Instructions (Signed)
Please take 30 Units in the morning and 18 units at night.

## 2011-06-09 NOTE — Progress Notes (Signed)
  Subjective:   Patient ID: Rachel Dougherty female   DOB: 09/10/1955 55 y.o.   MRN: [id number]  HPI: [name] is a 55 y.o. woman with poorly controlled diabetes who presents with headache and numbness/pain of her fingers and arms.  Headache: started 5 days ago, not everyday, up to 2 hours. Not taking anything for headache.   DM: She didn't get call to arrange appt with diabetes educator, appears that she wasn't referred. Always takes her insulin, is taking 25 units in AM, is taking 15 units at night or 18 units, if her blood sugar is greater than 175.   Peripheral Pain: patient has pain in her hands and arms mostly at night which makes it difficult to sleep. Also has symptoms in toes though doesn't complain of this. Complains of numbness as well.   Throat pain: when she get upset or angry, have pain in throat and loses voice. No pain or difficulty with swallowing.   Past Medical History  Diagnosis Date  . Diabetes mellitus type II   . Hyperthyroidism   . Fibroid uterus   . Hypercholesteremia   . Maternal blood transfusion     after miscarriage   Current Outpatient Prescriptions  Medication Sig Dispense Refill  . aspirin EC 81 MG tablet Take 81 mg by mouth daily.        . pravastatin (PRAVACHOL) 20 MG tablet Take 20 mg by mouth every evening.        . insulin NPH-insulin regular (HUMULIN 70/30 PEN) (70-30) 100 UNIT/ML injection 30 units in the morning, and 18 units in the evening.  3 mL  12  . traMADol (ULTRAM) 50 MG tablet Take 1 tablet (50 mg total) by mouth at bedtime as needed for pain. Maximum dose= 8 tablets per day  30 tablet  1   No family history on file. History   Social History  . Marital Status: Married    Spouse Name: N/A    Number of Children: N/A  . Years of Education: N/A   Social History Main Topics  . Smoking status: Never Smoker   . Smokeless tobacco: None  . Alcohol Use: No  . Drug Use: No  . Sexually Active: None   Other Topics Concern  . None     Social History Narrative   Had been a patient of the clinic in 2009 then moved back to [place]. Has returned to the [place] as of November 2012.   Objective:  Physical Exam: Filed Vitals:   06/09/11 1023  BP: 107/65  Pulse: 84  Temp: 97.7 F (36.5 C)  TempSrc: Oral  Weight: 162 lb 3.2 oz (73.573 kg)   General: veiled elderly woman with her sister and nephew and interpreter HEENT: PERRL, EOMI, no scleral icterus Cardiac: RRR, no rubs, murmurs or gallops Pulm: clear to auscultation bilaterally, moving normal volumes of air Neuro: alert and oriented X3, cranial nerves II-XII grossly intact Assessment & Plan:

## 2011-06-09 NOTE — Assessment & Plan Note (Signed)
Explained to patient and her sister Rachel Dougherty that her thyroid levels are now normal and she does not need medication for hyperthyroidism currently.

## 2011-06-09 NOTE — Assessment & Plan Note (Signed)
Patient describes numbness and pain which is likely secondary to peripheral neuropathy. Prescribed tramadol 50mg  QHS and can see if this improves her pain since she is only interested in a medication at night. Can consider gabapentin at next visit.

## 2011-06-10 ENCOUNTER — Telehealth: Payer: Self-pay | Admitting: *Deleted

## 2011-06-10 NOTE — Telephone Encounter (Signed)
Got a call back from MAP- patient was informed in Arabic that she had to apply for MAP ( come back with all the required paperwork) then return if accepted to meet with a pharmacist) prior to being able to get free medicines - ie insulin pens. She received vials from the pharmacy that she can use until she is able to get the pens ordered.   I will reinforce this plan as needed at patient's appointment with me.

## 2011-06-10 NOTE — Telephone Encounter (Signed)
Rachel Dougherty,  Patient wanted insulin pens instead of vials, [place] called to say she needs to have MAPP program to get pens. Can you address this at your visit with her?

## 2011-06-10 NOTE — Telephone Encounter (Signed)
Usually [facility] pharmacy sends patient to MAP program across the hall. I wonder if they didn't do this because of a problem with paperwork/citizenship. Called MAP and left message.

## 2011-06-10 NOTE — Telephone Encounter (Signed)
Call from [facility], [place]- questions about Insulin prescription.  Will need to be vials as they do not carry the Insulin Pens.  Pt will also need to sign up for MAPP to get the pens. Angelina Ok, RN 06/10/2011 12:23 PM

## 2011-06-30 ENCOUNTER — Encounter: Payer: Self-pay | Admitting: Dietician

## 2011-06-30 ENCOUNTER — Other Ambulatory Visit: Payer: Self-pay | Admitting: Internal Medicine

## 2011-06-30 ENCOUNTER — Ambulatory Visit (INDEPENDENT_AMBULATORY_CARE_PROVIDER_SITE_OTHER): Payer: Self-pay | Admitting: Dietician

## 2011-06-30 ENCOUNTER — Encounter: Payer: Self-pay | Admitting: Internal Medicine

## 2011-06-30 ENCOUNTER — Ambulatory Visit (INDEPENDENT_AMBULATORY_CARE_PROVIDER_SITE_OTHER): Payer: Self-pay | Admitting: Internal Medicine

## 2011-06-30 VITALS — BP 122/74 | HR 85 | Temp 97.7°F | Resp 20 | Ht 59.5 in | Wt 166.9 lb

## 2011-06-30 DIAGNOSIS — M5431 Sciatica, right side: Secondary | ICD-10-CM | POA: Insufficient documentation

## 2011-06-30 DIAGNOSIS — E119 Type 2 diabetes mellitus without complications: Secondary | ICD-10-CM

## 2011-06-30 DIAGNOSIS — M5430 Sciatica, unspecified side: Secondary | ICD-10-CM

## 2011-06-30 DIAGNOSIS — J069 Acute upper respiratory infection, unspecified: Secondary | ICD-10-CM

## 2011-06-30 LAB — GLUCOSE, CAPILLARY: Glucose-Capillary: 200 mg/dL — ABNORMAL HIGH (ref 70–99)

## 2011-06-30 MED ORDER — INSULIN NPH ISOPHANE & REGULAR (70-30) 100 UNIT/ML ~~LOC~~ SUSP: SUBCUTANEOUS | Status: DC

## 2011-06-30 MED ORDER — INSULIN ASPART PROT & ASPART (70-30 MIX) 100 UNIT/ML ~~LOC~~ SUSP: SUBCUTANEOUS | Status: DC

## 2011-06-30 MED ORDER — METFORMIN HCL 500 MG PO TABS: ORAL_TABLET | ORAL | Status: DC

## 2011-06-30 MED ORDER — MELOXICAM 15 MG PO TABS: 15.0000 mg | ORAL_TABLET | Freq: Every day | ORAL | Status: DC

## 2011-06-30 NOTE — Progress Notes (Signed)
Subjective:   Patient ID: Rachel Dougherty female   DOB: 09/10/1955 55 y.o.   MRN: [id number]  HPI: [name] is a 55 y.o. [place] woman who presents to clinic today for follow up from her last appointment.  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  Her doses of insulin were changed last time to 30 U in the AM and 18 U in the PM and she states that she has been taking her medication as prescribed.  She has been checking her blood sugars and has not noticed any <70.  She has questions about metformin since she was taking 1000 mg daily when she was in [place].  She was able to tolerate it at that time.    She states that she has a rash on her chest no itching for 10 days.  She states that it is mildly pruritic but has not been red or spread.  She has not tried anything for this.  She does wear traditional clothing and has noticed that it has been warm and that she has been sweating a lot in the last few days.    She states that she has had a cough with production of white sputum for last week.  She has some chest pain when coughing as well as some nasal congestion and headache.  She has had several people around her that have been sick as well.  She denies fevers, chills, nausea, vomiting, body aches, or decreased appetite.  She has had trouble sleeping because of the cough.    She also states that she continues to have pain behind her right knee when she flexes her knee.  She had an ultrasound of the area that did not show a clot or baker's cyst.  She did have a varicose vein in that area.  She also states that when she sits for an extended time that she gets pain in her buttocks that goes down the back of both legs.  She denies numbness, tingling, or weakness of the legs.    Past Medical History  Diagnosis Date  . Diabetes mellitus type II   . Hyperthyroidism   . Fibroid uterus   . Hypercholesteremia   . Maternal blood transfusion     after miscarriage   Current Outpatient  Prescriptions  Medication Sig Dispense Refill  . aspirin EC 81 MG tablet Take 81 mg by mouth daily.        . insulin NPH-insulin regular (HUMULIN 70/30 PEN) (70-30) 100 UNIT/ML injection 30 units in the morning, and 18 units in the evening.  3 mL  12  . pravastatin (PRAVACHOL) 20 MG tablet Take 20 mg by mouth every evening.        . traMADol (ULTRAM) 50 MG tablet Take 1 tablet (50 mg total) by mouth at bedtime as needed for pain. Maximum dose= 8 tablets per day  30 tablet  1   No family history on file. History   Social History  . Marital Status: Married    Spouse Name: N/A    Number of Children: N/A  . Years of Education: N/A   Social History Main Topics  . Smoking status: Never Smoker   . Smokeless tobacco: None  . Alcohol Use: No  . Drug Use: No  . Sexually Active: None   Other Topics Concern  . None   Social History Narrative   Had been a patient of the clinic in 2009 then moved back to [place]. Has  returned to the [place] as of November 2012.   Review of Systems: Negative except as noted in the HPI.  Objective:  Physical Exam: Filed Vitals:   06/30/11 1003  BP: 122/74  Pulse: 85  Temp: 97.7 F (36.5 C)  TempSrc: Oral  Resp: 20  Height: 4' 11.5" (1.511 m)  Weight: 166 lb 14.4 oz (75.705 kg)   Constitutional: Vital signs reviewed.  Patient is a well-developed and well-nourished woman in no acute distress and cooperative with exam. Alert and oriented x3.  Head: Normocephalic and atraumatic Ear: TM normal bilaterally Nose: Congestion noted with no erythema. Mouth: no erythema or exudates, MMM Eyes: PERRL, EOMI, conjunctivae normal, No scleral icterus.  Neck: Supple, Trachea midline normal ROM, No JVD, mass, thyromegaly, or carotid bruit present.  Cardiovascular: RRR, S1 normal, S2 normal, no MRG, pulses symmetric and intact bilaterally Pulmonary/Chest: cough with deep inspiration but CTAB, no wheezes, rales, or rhonchi Abdominal: Soft. Non-tender, non-distended, bowel  sounds are normal, no masses, organomegaly, or guarding present.  GU: no CVA tenderness Musculoskeletal: there is point tenderness in the right SI joint.  SLR is negative.  ROM of the bilateral hips is limited in forward flexion.  There is pain to palpation on the lateral posterior of the right knee.  There is a varicosity there that is palpable when she stands.  ROM of the bilateral knees is normal. No joint deformities, erythema, or stiffness Hematology: no cervical, inginal, or axillary adenopathy.  Neurological: A&O x3, Strength is normal and symmetric bilaterally, cranial nerve II-XII are grossly intact, no focal motor deficit, sensory intact to light touch bilaterally.  Skin: Warm, dry and intact. No rash, cyanosis, or clubbing.  Psychiatric: Normal mood and affect. speech and behavior is normal. Judgment and thought content normal. Cognition and memory are normal.   Assessment & Plan:

## 2011-06-30 NOTE — Patient Instructions (Addendum)
1.  You have a viral infection like a cold.  All of the things you are doing are helpful.    - The salt water gargles for the throat.    - You can also use Vicks Vaporub for the cough to help you rest.    - Tylenol 500 mg 2 tablets every 6 hours as needed for pain.    2.  You can use an over the counter hydrocortisone cream on the rash on your chest to help with the itching.  Try to keep the area as dry as possible.  3.  Restart Metformin.  Start with 1 tablet once daily for 1 week then 1 tablet twice daily for 1 week then 1 tablet in the AM and 2 tablet in the PM for 1 week then 2 tablets twice daily until your follow up.  4.  Continue your insulin as prescribed for now.  Continue checking your blood sugars as well.  5.  Start Meloxicam 15 mg take 1 tablet with food daily.    - you can also use Ice on the sore spot on your buttocks 20 minutes at a time several times daily.   6.  Follow up in about 15 days.

## 2011-06-30 NOTE — Progress Notes (Signed)
Diabetes Self-Management Training (DSMT)  Follow-Up 1 Visit  06/30/2011 [name], identified by name and date of birth, is a 55 y.o. female with Type 2 Diabetes. Year of diabetes diagnosis: will ask at next visit Other persons present: niece and interpreter  ASSESSMENT Patient concerns are Glycemic control.  Last menstrual period 05/21/2010. There is no height or weight on file to calculate BMI. Lab Results  Component Value Date   LDLCALC 180* 05/22/2011   Lab Results  Component Value Date   HGBA1C 12.0 05/22/2011   Medication Nutrition Monitor: patient questioning accuracy of meter. Showed/discussed improved technique for self monitoring to decrease user error  Labs reviewed.  DIABETES BUNDLE: A1C in past 6 months? Yes.  Less than 7%? No LDL in past year? Yes.  Less than 100 mg/dL? No Microalbumin ratio in past year? Yes. Blood pressure less than 130/80? Yes. Foot exam in last year? Yes. Eye exam in past year? No.  Reminded patient to schedule eye exam. Tobacco use? No. Pneumovax? No Flu vaccine? No Asprin? Yes  Family history of diabetes: No  Support systems: family  Special needs: english as second language  Prior DM Education: Yes   Medications See Medications list.  Not taking as prescribed  Patients belief/attitude about diabetes: Diabetes can be controlled.  Self foot exams daily: Yes  Diabetes Complications: None   Exercise Plan Doing ADLs .   Self-Monitoring Frequency of testing: 2-3 times/day Breakfast: 7am before her injection  Supper:9-10 am before her second injection  Hyperglycemia: Yes Weekly Hypoglycemia: Yes Rarely   Meal Planning Some knowledge   Assessment comments: patient remembers for last visit in 2009 to mix her insulin and get all air out before injecting. Also to eat small portions of bread and carbohydrate.     INDIVIDUAL DIABETES EDUCATION PLAN:   Medication _______________________________________________________________________  Intervention TOPICS COVERED TODAY:  Medication  Reviewed patients medication for diabetes, action, purpose, timing of dose and side effects.  PATIENTS GOALS/PLAN (copy and paste in patient instructions so patient receives a copy): 1.  Learning Objective:       Know there are different types of insulin and each has different timing regarding when to take in relation to meals 2.  Behavioral Objective:         Medications: To improve blood glucose levels, I will take my medication as prescribed and 30 minutes before meals Half of the time 50%  Personalized Follow-Up Plan for Ongoing Self Management Support:  Doctor's Office, friends, family and CDE visits ______________________________________________________________________   Outcomes Expected outcomes: Demonstrated interest in learning.Expect positive changes in lifestyle.  Self-care Barriers: English as second language, Lack of transportation, Lack of material resources  Education material provided: none except schedule on patient instructions  Patient to contact team via Phone and visits if problems or questions.  Time in: 1100     Time out: 1140  Future DSMT - 2 wks   Martesha Niedermeier, Lupita Leash

## 2011-06-30 NOTE — Patient Instructions (Signed)
Please take insulin Novolin 70/30 30 minutes before eating meal For example:  915 am- Check  Blood sugar 930 am- take insulin 10 am - eat meal  3-4 PM Eat meal  830- check blood sugar 845- take insulin  900 am- eat meal  MAP = Medication Assistance Program- they are in the same building at [facility]- may be on another floor- ask at the pharmacy. They may have to change your insulin to a different type to get insulin pens: Novolog Mix 70/30- this insulin should be fine for you.  Please make an appointment with CDE each time you see the doctor. In 15 days.

## 2011-07-02 ENCOUNTER — Encounter: Payer: Self-pay | Admitting: Obstetrics and Gynecology

## 2011-07-02 ENCOUNTER — Ambulatory Visit (INDEPENDENT_AMBULATORY_CARE_PROVIDER_SITE_OTHER): Payer: Self-pay | Admitting: Obstetrics and Gynecology

## 2011-07-02 VITALS — BP 128/80 | HR 95 | Temp 99.1°F | Ht 63.0 in | Wt 166.9 lb

## 2011-07-02 DIAGNOSIS — R05 Cough: Secondary | ICD-10-CM

## 2011-07-02 DIAGNOSIS — Z1231 Encounter for screening mammogram for malignant neoplasm of breast: Secondary | ICD-10-CM

## 2011-07-02 DIAGNOSIS — R059 Cough, unspecified: Secondary | ICD-10-CM

## 2011-07-02 DIAGNOSIS — Z01419 Encounter for gynecological examination (general) (routine) without abnormal findings: Secondary | ICD-10-CM

## 2011-07-02 MED ORDER — BENZONATATE 100 MG PO CAPS: 100.0000 mg | ORAL_CAPSULE | Freq: Three times a day (TID) | ORAL | Status: DC | PRN

## 2011-07-02 NOTE — Patient Instructions (Signed)
Mammogram A mammogram is an X-ray test to find changes in a woman's breast. You should get a mammogram if:  You are over 56 years of age.   You have risk factors.   Your doctor recommends that you have one.  BEFORE THE TEST  Do not schedule the test the week before your period, especially if your breasts are sore during this time.  On the day of your mammogram:  Wash your breasts and armpits well. After washing, do not put on any deodorant or talcum powder on until after your test.   Eat and drink as you usually do.   Take your medicines as usual.   If you are diabetic and take insulin, make sure you:   Eat before coming for your test.   Take your insulin as usual.   If you cannot keep your appointment, call before the appointment to cancel. Schedule another appointment.  TEST  You will need to undress from the waist up. You will put on a hospital gown.   Your breast will be put on the mammogram machine, and it will press firmly on your breast with a piece of plastic called a compression paddle. This will make your breast flatter so that the machine can X-ray all parts of your breast.   Both breasts will be X-rayed. Each breast will be X-rayed from above and from the side. An X-ray might need to be taken again if the picture is not good enough.   The mammogram will last about 15 to 30 minutes.  AFTER THE TEST Finding out the results of your test Ask when your test results will be ready. Make sure you get your test results. Document Released: 09/26/2008 Document Revised: 03/12/2011 Document Reviewed: 09/26/2008 Exit[employer] Patient Information 2012 [employer]. 

## 2011-07-02 NOTE — Progress Notes (Signed)
Rachel E Ahmed55 y.W.[id number] Chief Complaint  Patient presents with  . Fibroids    SUBJECTIVE  HPI: [name] is referred here from internal medicine clinic where she had a complete physical exam last month. She is followed there for her diabetes and was also seen for cough in November.She had reported some abdominal pain and had a pelvic ultrasound done which was completely normal except a 1.3 cm left posterior fundal intramural fibroid. Endometrial stripe was 6 mm. She is asking if she can have a hysterectomy because of this. She's had no vaginal bleeding and has been menopausal for almost 2 years. She states she's not been sexually active for about 26 years. She denies any abdominal pain today. 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 She's also reporting dysuria and frequency of urination. She continues to have a nagging dry cough and difficulty sleeping due to cough, but no fever or chest pain and would like medication for this. Last mammogram was normal 3 years ago. Last Pap was about 1 1/2 years ago and was also normal.     Past Medical History  Diagnosis Date  . Diabetes mellitus type II   . Hyperthyroidism   . Fibroid uterus   . Hypercholesteremia   . Maternal blood transfusion     after miscarriage   Past Surgical History  Procedure Date  . Repeat cesarean section     5 C-section  . Tubal ligation   . Cesarean section   . Female circumcision    History   Social History  . Marital Status: Married    Spouse Name: N/A    Number of Children: N/A  . Years of Education: N/A   Occupational History  . Not on file.   Social History Main Topics  . Smoking status: Never Smoker   . Smokeless tobacco: Not on file  . Alcohol Use: No  . Drug Use: No  . Sexually Active: No   Other Topics Concern  . Not on file   Social History Narrative   Had been a patient of the clinic in 2009 then moved back to [place]. Has returned to the [place] as of November 2012.   Current  Outpatient Prescriptions on File Prior to Visit  Medication Sig Dispense Refill  . aspirin EC 81 MG tablet Take 81 mg by mouth daily.        . insulin aspart protamine-insulin aspart (NOVOLOG MIX 70/30 FLEXPEN) (70-30) 100 UNIT/ML injection Inject 30 units in the AM and 18 units in the PM.  45 mL  4  . meloxicam (MOBIC) 15 MG tablet Take 1 tablet (15 mg total) by mouth daily.  30 tablet  0  . pravastatin (PRAVACHOL) 20 MG tablet Take 20 mg by mouth every evening.        . traMADol (ULTRAM) 50 MG tablet Take 1 tablet (50 mg total) by mouth at bedtime as needed for pain. Maximum dose= 8 tablets per day  30 tablet  1   Allergies  Allergen Reactions  . Penicillins Rash    ROS: Pertinent items in HPI  OBJECTIVE  BP 128/80  Pulse 95  Temp(Src) 99.1 F (37.3 C) (Oral)  Ht 5\' 3"  (1.6 m)  Wt 166 lb 14.4 oz (75.705 kg)  BMI 29.56 kg/m2  LMP 05/21/2010  Physical Exam  Constitutional: She is oriented to person, place, and time and well-developed, well-nourished, and in no distress.  HENT:  Head: Normocephalic.  Eyes: EOM are normal.  Neck: Neck supple.  Cardiovascular: Normal rate.   Pulmonary/Chest: Effort normal and breath sounds normal.  Abdominal: Soft. There is no tenderness. There is no guarding.  Genitourinary:       Atrophic vulva na dvag mucosa. Scant white discharge. 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 Able to visualize part of anterior lip of cervix. Pap smear done and with brush there was slight bleeding. Able to perform a bimanual due to her resistance with the attempted exam.  Musculoskeletal: Normal range of motion.  Neurological: She is alert and oriented to person, place, and time.  Skin: Skin is warm and dry.  Psychiatric: Affect normal.  Breasts: pendulous; no discrete masses; no lymphadenopathy  Friend used for interpreter ASSESSMENT Small asymtomatic fibroid Viral URI DM      PLAN Done.  Mammogram scheduled. Urinalysis and culture will be done. Prescribed Tessalon Perles for cough suppression. Followup 1 year or sooner based on Pap result

## 2011-07-03 LAB — URINALYSIS, ROUTINE W REFLEX MICROSCOPIC
Ketones, ur: NEGATIVE mg/dL
Leukocytes, UA: NEGATIVE
Nitrite: NEGATIVE
Protein, ur: NEGATIVE mg/dL

## 2011-07-03 LAB — URINALYSIS, MICROSCOPIC ONLY: Bacteria, UA: NONE SEEN

## 2011-07-06 ENCOUNTER — Encounter (HOSPITAL_COMMUNITY): Payer: Self-pay

## 2011-07-06 ENCOUNTER — Emergency Department (INDEPENDENT_AMBULATORY_CARE_PROVIDER_SITE_OTHER): Payer: Self-pay

## 2011-07-06 ENCOUNTER — Emergency Department (INDEPENDENT_AMBULATORY_CARE_PROVIDER_SITE_OTHER)
Admission: EM | Admit: 2011-07-06 | Discharge: 2011-07-06 | Disposition: A | Discharge status: Home / Self Care | Payer: Self-pay | Source: Home / Self Care | Attending: Family Medicine | Admitting: Family Medicine

## 2011-07-06 DIAGNOSIS — J40 Bronchitis, not specified as acute or chronic: Secondary | ICD-10-CM

## 2011-07-06 MED ORDER — ALBUTEROL SULFATE HFA 108 (90 BASE) MCG/ACT IN AERS
2.0000 | INHALATION_SPRAY | RESPIRATORY_TRACT | Status: DC | PRN
  Administered 2011-07-06 (×2): 2 via RESPIRATORY_TRACT

## 2011-07-06 MED ORDER — GUAIFENESIN-CODEINE 100-10 MG/5ML PO SYRP: 5.0000 mL | ORAL_SOLUTION | Freq: Four times a day (QID) | ORAL | Status: AC | PRN

## 2011-07-06 MED ORDER — ALBUTEROL SULFATE HFA 108 (90 BASE) MCG/ACT IN AERS
INHALATION_SPRAY | RESPIRATORY_TRACT | Status: AC
  Filled 2011-07-06: qty 6.7

## 2011-07-06 NOTE — ED Provider Notes (Signed)
History     CSN: [id number]  Arrival date & time 07/06/11  4098   First MD Initiated Contact with Patient 07/06/11 1011      Chief Complaint  Patient presents with  . Cough    (Consider location/radiation/quality/duration/timing/severity/associated sxs/prior treatment) HPI Comments: Rachel Dougherty presents for persistent non-productive cough. She denies any fever. She reports sx for 15 days and was given Tessalon perles by PCP without relief. She reports decrease in sleeping.   Patient is a 55 y.o. female presenting with cough. The history is provided by the patient and a relative. The history is limited by a language barrier. A language interpreter was used.  Cough This is a new problem. The current episode started more than 1 week ago. The problem occurs constantly. The problem has not changed since onset.The cough is non-productive. There has been no fever. Associated symptoms include chest pain, rhinorrhea and shortness of breath. Pertinent negatives include no chills, no sore throat and no myalgias. Treatments tried: Tessalon perles. The treatment provided no relief.    Past Medical History  Diagnosis Date  . Diabetes mellitus type II   . Hyperthyroidism   . Fibroid uterus   . Hypercholesteremia   . Maternal blood transfusion     after miscarriage    Past Surgical History  Procedure Date  . Repeat cesarean section     5 C-section  . Tubal ligation   . Cesarean section   . Female circumcision     Family History  Problem Relation Age of Onset  . Diabetes Mother   . Hypertension Mother   . Hyperlipidemia Mother   . Diabetes Brother     History  Substance Use Topics  . Smoking status: Never Smoker   . Smokeless tobacco: Not on file  . Alcohol Use: No    OB History    Grav Para Term Preterm Abortions TAB SAB Ect Mult Living   6 5 5  0 1 0 1 0 0 5      Review of Systems  Constitutional: Negative for fever and chills.  HENT: Positive for congestion and rhinorrhea.  Negative for sore throat and voice change.   Eyes: Negative.   Respiratory: Positive for cough and shortness of breath.   Cardiovascular: Positive for chest pain.  Gastrointestinal: Negative.   Genitourinary: Negative.   Musculoskeletal: Negative.  Negative for myalgias.  Skin: Negative.   Neurological: Negative.     Allergies  Penicillins  Home Medications   Current Outpatient Rx  Name Route Sig Dispense Refill  . ASPIRIN EC 81 MG PO TBEC Oral Take 81 mg by mouth daily.      . GUAIFENESIN-CODEINE 100-10 MG/5ML PO SYRP Oral Take 5 mLs by mouth every 6 (six) hours as needed for cough or congestion. 120 mL 0  . INSULIN ASPART PROT & ASPART (70-30) 100 UNIT/ML [place] SUSP  Inject 30 units in the AM and 18 units in the PM. 45 mL 4  . MELOXICAM 15 MG PO TABS Oral Take 1 tablet (15 mg total) by mouth daily. 30 tablet 0  . METFORMIN HCL 500 MG PO TABS  500 mg daily. Take as directed in your discharge instructions.     Marland Kitchen PRAVASTATIN SODIUM 20 MG PO TABS Oral Take 20 mg by mouth every evening.      Marland Kitchen TRAMADOL HCL 50 MG PO TABS Oral Take 1 tablet (50 mg total) by mouth at bedtime as needed for pain. Maximum dose= 8 tablets per day 30 tablet  1    BP 119/79  Pulse 92  Temp(Src) 98.5 F (36.9 C) (Oral)  Resp 20  SpO2 94%  LMP 05/21/2010  Physical Exam  Constitutional: She is oriented to person, place, and time. She appears well-developed and well-nourished.  HENT:  Head: Normocephalic and atraumatic.  Right Ear: Tympanic membrane and external ear normal.  Left Ear: Tympanic membrane and external ear normal.  Nose: Nose normal.  Mouth/Throat: Uvula is midline, oropharynx is clear and moist and mucous membranes are normal.  Eyes: EOM are normal. Pupils are equal, round, and reactive to light.  Neck: Normal range of motion.  Cardiovascular: Normal rate and regular rhythm.   Pulmonary/Chest: Effort normal. She has wheezes in the right lower field, the left upper field, the left middle field  and the left lower field.  Neurological: She is alert and oriented to person, place, and time.  Skin: Skin is warm and dry.    ED Course  Procedures (including critical care time)  Labs Reviewed - No data to display Dg Chest 2 View  07/06/2011  *RADIOLOGY REPORT*  Clinical Data: Persistent cough.  CHEST - 2 VIEW  Comparison: None  Findings: Heart and mediastinal contours are within normal limits. No focal opacities or effusions.  No acute bony abnormality.  IMPRESSION: No active cardiopulmonary disease.  Original Report Authenticated By: Cyndie Chime, M.D.     1. Bronchitis       MDM  Treated with albuterol, guaifenesin AC        Richardo Priest, MD 07/06/11 1222

## 2011-07-06 NOTE — ED Notes (Signed)
Pt has cough and congestion and was seen last week at Outpt clinic and no improvement.

## 2011-07-17 ENCOUNTER — Encounter: Payer: Self-pay | Admitting: Dietician

## 2011-07-17 ENCOUNTER — Ambulatory Visit: Payer: Self-pay | Admitting: Internal Medicine

## 2011-07-27 ENCOUNTER — Emergency Department (INDEPENDENT_AMBULATORY_CARE_PROVIDER_SITE_OTHER)
Admission: EM | Admit: 2011-07-27 | Discharge: 2011-07-27 | Disposition: A | Discharge status: Home / Self Care | Payer: Self-pay | Source: Home / Self Care | Attending: Family Medicine | Admitting: Family Medicine

## 2011-07-27 ENCOUNTER — Emergency Department (INDEPENDENT_AMBULATORY_CARE_PROVIDER_SITE_OTHER): Payer: Self-pay

## 2011-07-27 ENCOUNTER — Encounter (HOSPITAL_COMMUNITY): Payer: Self-pay | Admitting: *Deleted

## 2011-07-27 DIAGNOSIS — J329 Chronic sinusitis, unspecified: Secondary | ICD-10-CM

## 2011-07-27 MED ORDER — CEFTRIAXONE SODIUM 1 G IJ SOLR
INTRAMUSCULAR | Status: AC
  Filled 2011-07-27: qty 10

## 2011-07-27 MED ORDER — IBUPROFEN 600 MG PO TABS: 600.0000 mg | ORAL_TABLET | Freq: Three times a day (TID) | ORAL | Status: AC | PRN

## 2011-07-27 MED ORDER — FLUTICASONE PROPIONATE 50 MCG/ACT NA SUSP: NASAL | Status: AC

## 2011-07-27 MED ORDER — LIDOCAINE HCL (PF) 1 % IJ SOLN
INTRAMUSCULAR | Status: AC
  Filled 2011-07-27: qty 5

## 2011-07-27 MED ORDER — ONDANSETRON 4 MG PO TBDP
4.0000 mg | ORAL_TABLET | Freq: Once | ORAL | Status: AC
  Administered 2011-07-27: 4 mg via ORAL

## 2011-07-27 MED ORDER — HYDROCODONE-ACETAMINOPHEN 5-325 MG PO TABS
ORAL_TABLET | ORAL | Status: AC
  Filled 2011-07-27: qty 1

## 2011-07-27 MED ORDER — HYDROCODONE-ACETAMINOPHEN 5-500 MG PO TABS: 1.0000 | ORAL_TABLET | Freq: Four times a day (QID) | ORAL | Status: AC | PRN

## 2011-07-27 MED ORDER — CEFTRIAXONE SODIUM 1 G IJ SOLR
1.0000 g | Freq: Once | INTRAMUSCULAR | Status: AC
  Administered 2011-07-27: 1 g via INTRAMUSCULAR

## 2011-07-27 MED ORDER — DOXYCYCLINE HYCLATE 100 MG PO CAPS: 100.0000 mg | ORAL_CAPSULE | Freq: Two times a day (BID) | ORAL | Status: AC

## 2011-07-27 MED ORDER — ONDANSETRON 4 MG PO TBDP
ORAL_TABLET | ORAL | Status: AC
  Filled 2011-07-27: qty 1

## 2011-07-27 MED ORDER — HYDROCODONE-ACETAMINOPHEN 10-325 MG PO TABS
1.0000 | ORAL_TABLET | Freq: Once | ORAL | Status: AC
  Administered 2011-07-27: 1 via ORAL

## 2011-07-27 NOTE — ED Provider Notes (Signed)
History     CSN: [id number]  Arrival date & time 07/27/11  1512   First MD Initiated Contact with Patient 07/27/11 1549      Chief Complaint  Patient presents with  . Fever    (Consider location/radiation/quality/duration/timing/severity/associated sxs/prior treatment) HPI Comments: 56 y/o insulin dependent diabetic female here c/o cough and congestion for several weeks, was treated symptomatically on 07/06/11 for bronchitis. Reports pain in right side of face associated with tick yellow rhinorrhea. Started with fever body aches 3 days ago, decreased appetite headache. Blood sugar in 200's range using her insuline as usual. Reports on and off left mid back pain worse with cough and movement. No vomiting or diarrhea. Taking over the counter ibuprofen, reports taking last dose about 1 hours prior coming here. Denies shortness of breath or wheezing.   Past Medical History  Diagnosis Date  . Diabetes mellitus type II   . Hyperthyroidism   . Fibroid uterus   . Hypercholesteremia   . Maternal blood transfusion     after miscarriage    Past Surgical History  Procedure Date  . Repeat cesarean section     5 C-section  . Tubal ligation   . Cesarean section   . Female circumcision     Family History  Problem Relation Age of Onset  . Diabetes Mother   . Hypertension Mother   . Hyperlipidemia Mother   . Diabetes Brother     History  Substance Use Topics  . Smoking status: Never Smoker   . Smokeless tobacco: Not on file  . Alcohol Use: No    OB History    Grav Para Term Preterm Abortions TAB SAB Ect Mult Living   6 5 5  0 1 0 1 0 0 5      Review of Systems  Constitutional: Positive for fever. Negative for chills.  HENT: Positive for congestion, rhinorrhea and sinus pressure. Negative for sore throat and trouble swallowing.   Respiratory: Positive for cough. Negative for shortness of breath and wheezing.   Cardiovascular: Negative for leg swelling.  Gastrointestinal:  Positive for nausea. Negative for abdominal pain.  Musculoskeletal: Positive for back pain.  Skin: Negative for rash.  Neurological: Positive for headaches.    Allergies  Penicillins  Home Medications   Current Outpatient Rx  Name Route Sig Dispense Refill  . ASPIRIN EC 81 MG PO TBEC Oral Take 81 mg by mouth daily.      Marland Kitchen DOXYCYCLINE HYCLATE 100 MG PO CAPS Oral Take 1 capsule (100 mg total) by mouth 2 (two) times daily. 20 capsule 0  . FLUTICASONE PROPIONATE 50 MCG/ACT NA SUSP  2 spray in each nostril daily 16 g 0  . HYDROCODONE-ACETAMINOPHEN 5-500 MG PO TABS Oral Take 1-2 tablets by mouth every 6 (six) hours as needed for pain. 15 tablet 0  . IBUPROFEN 600 MG PO TABS Oral Take 1 tablet (600 mg total) by mouth every 8 (eight) hours as needed for pain or fever. 20 tablet 0  . INSULIN ASPART PROT & ASPART (70-30) 100 UNIT/ML [place] SUSP  Inject 30 units in the AM and 18 units in the PM. 45 mL 4  . MELOXICAM 15 MG PO TABS Oral Take 1 tablet (15 mg total) by mouth daily. 30 tablet 0  . METFORMIN HCL 500 MG PO TABS  500 mg daily. Take as directed in your discharge instructions.     Marland Kitchen PRAVASTATIN SODIUM 20 MG PO TABS Oral Take 20 mg by mouth every evening.      Marland Kitchen  TRAMADOL HCL 50 MG PO TABS Oral Take 1 tablet (50 mg total) by mouth at bedtime as needed for pain. Maximum dose= 8 tablets per day 30 tablet 1    BP 129/83  Pulse 96  Temp(Src) 98.9 F (37.2 C) (Oral)  Resp 18  SpO2 99%  LMP 05/21/2010  Physical Exam  Nursing note and vitals reviewed. Constitutional: She is oriented to person, place, and time. She appears well-developed and well-nourished. No distress.  HENT:  Head: Normocephalic and atraumatic.       Nose: swelling and erythema of nasal turbinates. Yellow rhinorrhea. Sinus tenderness more right maxillary than left. TMs: increased vascular markings no swelling or bulging.  Pharyngeal erythema with post nasal drip. No exudates.  Eyes: Conjunctivae and EOM are normal. Pupils  are equal, round, and reactive to light.  Neck: Neck supple.  Cardiovascular: Normal heart sounds.   Pulmonary/Chest: Breath sounds normal. No respiratory distress. She has no wheezes. She has no rales. She exhibits no tenderness.  Abdominal: Soft. There is no tenderness.  Lymphadenopathy:    She has no cervical adenopathy.  Neurological: She is alert and oriented to person, place, and time.  Skin: No rash noted.    ED Course  Procedures (including critical care time)  Labs Reviewed - No data to display Dg Chest 2 View  07/27/2011  *RADIOLOGY REPORT*  Clinical Data: Cough and fever.  Left chest pain.  CHEST - 2 VIEW  Comparison: PA and lateral chest 07/06/2011.  Findings: Lungs are clear.  Heart size normal.  No pneumothorax or effusion.  No focal bony abnormality.  IMPRESSION: Negative chest.  Original Report Authenticated By: Bernadene Bell. D'ALESSIO, M.D.     1. Sinusitis       MDM  Diabetic with allergies to penicillin. Sinusitis with reported fever decided to administer rocephin 1 g here and continue with doxycycline. Reccommended close follow up will consider Sinus CT if no symptoms improvement on follow up.        Sharin Grave, MD 07/29/11 [id number]

## 2011-07-27 NOTE — ED Notes (Signed)
Co fever with pain in right ear, ha, nausea x 3 days, decreased appetite, has taken advil

## 2011-07-28 ENCOUNTER — Encounter: Payer: Self-pay | Admitting: Dietician

## 2011-07-28 ENCOUNTER — Encounter: Payer: Self-pay | Admitting: Internal Medicine

## 2011-07-28 NOTE — Assessment & Plan Note (Signed)
Lab Results  Component Value Date   HGBA1C 12.0 05/22/2011   HGBA1C 9.4* 04/10/2008   HGBA1C 9.9 02/04/2008   Lab Results  Component Value Date   MICROALBUR 0.50 05/22/2011   LDLCALC 180* 05/22/2011   CREATININE 0.40* 04/16/2011   Her A1C was extremely elevated at her last visit but she has been taking her medication correctly lately and her blood sugars are better because of it.  We will plan on restarting metformin and working our way up to 1000 mg BID.  She is planning on returning to [place] in about 6 weeks so she will come back before then for a recheck of her diabetes and see how she is doing before she goes.

## 2011-07-28 NOTE — Assessment & Plan Note (Signed)
Her physical exam findings are consistent with Sciatica on the right side.  We will have her use Ice and meloxicam daily for the pain to see if that helps her.  The next option would be PT for flexibility and possibly narcotic pain medication to help her.

## 2011-07-28 NOTE — Assessment & Plan Note (Signed)
She has a viral URI.  We will treat symptomatically for now with saline gargles, OTC cough syrup, and tylenol for pain or fever.

## 2011-07-31 ENCOUNTER — Ambulatory Visit (HOSPITAL_COMMUNITY)
Admission: RE | Admit: 2011-07-31 | Discharge: 2011-07-31 | Disposition: A | Discharge status: Home / Self Care | Payer: Self-pay | Source: Ambulatory Visit | Attending: Obstetrics and Gynecology | Admitting: Obstetrics and Gynecology

## 2011-07-31 DIAGNOSIS — Z1231 Encounter for screening mammogram for malignant neoplasm of breast: Secondary | ICD-10-CM | POA: Insufficient documentation

## 2011-08-07 ENCOUNTER — Telehealth: Payer: Self-pay | Admitting: *Deleted

## 2011-08-07 NOTE — Telephone Encounter (Signed)
Patient family member called and left message patient wanting results from test last month. Called patient with [facility] [phone] and patient requested biopsy results. Informed patient she had a pap smear , not biopsy and pap smear was negative. No other concerns voiced.

## 2011-08-11 ENCOUNTER — Telehealth: Payer: Self-pay | Admitting: *Deleted

## 2011-08-11 NOTE — Telephone Encounter (Signed)
CALLED AND LVM FOR PATIENT TO RETURN CALL TO [facility] TO GET SET UP WITH APPT WITH [facility]. [phone] PHONE NUMBER IS NO GOOD FOR CONTACT .  LELA STURDIVANT NTII  1-28-013  12:23PM

## 2011-08-15 ENCOUNTER — Encounter: Payer: Self-pay | Admitting: Ophthalmology

## 2011-08-29 ENCOUNTER — Ambulatory Visit (INDEPENDENT_AMBULATORY_CARE_PROVIDER_SITE_OTHER): Payer: Self-pay | Admitting: Ophthalmology

## 2011-08-29 ENCOUNTER — Encounter: Payer: Self-pay | Admitting: Ophthalmology

## 2011-08-29 VITALS — BP 123/78 | HR 86 | Temp 97.8°F | Ht 61.5 in | Wt 166.6 lb

## 2011-08-29 DIAGNOSIS — M79641 Pain in right hand: Secondary | ICD-10-CM | POA: Insufficient documentation

## 2011-08-29 DIAGNOSIS — M5430 Sciatica, unspecified side: Secondary | ICD-10-CM

## 2011-08-29 DIAGNOSIS — M79609 Pain in unspecified limb: Secondary | ICD-10-CM

## 2011-08-29 DIAGNOSIS — M79642 Pain in left hand: Secondary | ICD-10-CM

## 2011-08-29 DIAGNOSIS — M799 Soft tissue disorder, unspecified: Secondary | ICD-10-CM

## 2011-08-29 DIAGNOSIS — E119 Type 2 diabetes mellitus without complications: Secondary | ICD-10-CM

## 2011-08-29 DIAGNOSIS — M5431 Sciatica, right side: Secondary | ICD-10-CM

## 2011-08-29 DIAGNOSIS — Z79899 Other long term (current) drug therapy: Secondary | ICD-10-CM

## 2011-08-29 LAB — POCT GLYCOSYLATED HEMOGLOBIN (HGB A1C): Hemoglobin A1C: 8.4

## 2011-08-29 LAB — GLUCOSE, CAPILLARY: Glucose-Capillary: 244 mg/dL — ABNORMAL HIGH (ref 70–99)

## 2011-08-29 MED ORDER — MELOXICAM 15 MG PO TABS: 15.0000 mg | ORAL_TABLET | Freq: Every day | ORAL | Status: AC

## 2011-08-29 NOTE — Assessment & Plan Note (Signed)
Patient's joint exam is not impressive and joints are normal appearing aside from PIP joint swelling which suggest osteoarthritis. Will treat with meloxicam and consider bilateral hand X-rays or steroid injections if her pain persists.

## 2011-08-29 NOTE — Assessment & Plan Note (Signed)
Patient's A1c is 8.4 which is much improved from 12 on last check. Continue current regimen.

## 2011-08-29 NOTE — Patient Instructions (Signed)
Please take meloxicam daily. This will help with your pain in your hands and your knees.

## 2011-08-29 NOTE — Progress Notes (Signed)
Subjective:   Patient ID: Rachel Dougherty female   DOB: 09/10/1955 56 y.o.   MRN: [id number]  HPI: [name] is a 56 y.o. woman from [place] who presents with an interpreter for multiple complaints of pain.   Pain in hands- pain in all fingers up to her elbow, hurts all day, worse at night.  Hands have been hurting for 1 month. Pain is 9/10. Feels like pain is inside bones. All 5 fingers feel numb in the morning. Both hands are the same.   Pain in knees and lower back- hurts at night and feels stiff in the morning. Hurts more with changing position. Also anterior right leg hurts. This pain is not knew and she was evaluated for similar pain by Dr. Tonny Branch in December.  Also concerned about lump in her right cheek. It is also painful per reports but patient continues to point at it and pinch it with no apparent pain.  Past Medical History  Diagnosis Date  . Diabetes mellitus type II   . Hyperthyroidism   . Fibroid uterus   . Hypercholesteremia   . Maternal blood transfusion     after miscarriage   Current Outpatient Prescriptions  Medication Sig Dispense Refill  . aspirin EC 81 MG tablet Take 81 mg by mouth daily.        . fluticasone (FLONASE) 50 MCG/ACT nasal spray 2 spray in each nostril daily  16 g  0  . insulin aspart protamine-insulin aspart (NOVOLOG MIX 70/30 FLEXPEN) (70-30) 100 UNIT/ML injection Inject 30 units in the AM and 18 units in the PM.  45 mL  4  . meloxicam (MOBIC) 15 MG tablet Take 1 tablet (15 mg total) by mouth daily.  30 tablet  0  . metFORMIN (GLUCOPHAGE) 500 MG tablet 500 mg daily. Take as directed in your discharge instructions.       . pravastatin (PRAVACHOL) 20 MG tablet Take 20 mg by mouth every evening.        . traMADol (ULTRAM) 50 MG tablet Take 1 tablet (50 mg total) by mouth at bedtime as needed for pain. Maximum dose= 8 tablets per day  30 tablet  1   Family History  Problem Relation Age of Onset  . Diabetes Mother   . Hypertension Mother   .  Hyperlipidemia Mother   . Diabetes Brother    History   Social History  . Marital Status: Married    Spouse Name: N/A    Number of Children: N/A  . Years of Education: N/A   Social History Main Topics  . Smoking status: Never Smoker   . Smokeless tobacco: None  . Alcohol Use: No  . Drug Use: No  . Sexually Active: No   Other Topics Concern  . None   Social History Narrative   Had been a patient of the clinic in 2009 then moved back to [place]. Has returned to the [place] as of November 2012.   Objective:  Physical Exam: Filed Vitals:   08/29/11 1552  BP: 123/78  Pulse: 86  Temp: 97.8 F (36.6 C)  TempSrc: Oral  Height: 5' 1.5" (1.562 m)  Weight: 166 lb 9.6 oz (75.569 kg)  SpO2: 99%   General: pleasant middle aged woman with head covering in no apparent distress HEENT: PERRL, EOMI, no scleral icterus Cardiac: RRR, no rubs, murmurs or gallops Pulm: clear to auscultation bilaterally, moving normal volumes of air Abd: soft, nontender, nondistended, BS present Ext: warm and well perfused,  no pedal edema Hand exam: patient has widened PIP joints but normal appearing DIP and MCP joints. Has 4/5 reduced grip strength bilaterally. Hip exam: patient has pain in lower back in right SI area with right hip external rotation. Has pain in left anterior hip external rotation of left hip. Has pain in lower back in right paraspinal/SI area to palpation. Has tenderness of upper anterior right thigh. Patient has atalgic gait. Neuro: alert and oriented X3, cranial nerves II-XII grossly intact Assessment & Plan:

## 2011-08-29 NOTE — Assessment & Plan Note (Signed)
Slight nodule in right cheek is likely lymph node related to recent episode of sinusitis and some exaggeration of current pain. Will monitor for change in appearance.

## 2011-08-29 NOTE — Assessment & Plan Note (Signed)
Patient had stopped meloxicam due to running out in January. This helped her pain in her legs somewhat. Will continue this and consider imaging of her hip or PT at her next visit if not improved.

## 2011-11-21 ENCOUNTER — Encounter: Payer: Self-pay | Admitting: Ophthalmology

## 2012-02-12 ENCOUNTER — Telehealth: Payer: Self-pay | Admitting: Dietician

## 2012-02-12 NOTE — Telephone Encounter (Signed)
Removed the incorrect phone number and mailing the patient a letter requesting her to call to make an appointment.

## 2012-02-26 ENCOUNTER — Encounter: Payer: Self-pay | Admitting: Internal Medicine

## 2012-07-22 ENCOUNTER — Encounter: Payer: Self-pay | Admitting: Internal Medicine

## 2013-01-20 ENCOUNTER — Other Ambulatory Visit: Payer: Self-pay

## 2014-05-15 ENCOUNTER — Encounter: Payer: Self-pay | Admitting: Ophthalmology

## 2016-07-15 ENCOUNTER — Emergency Department (HOSPITAL_COMMUNITY)
Admission: EM | Admit: 2016-07-15 | Discharge: 2016-07-16 | Disposition: A | Discharge status: Home / Self Care | Payer: Self-pay | Attending: Emergency Medicine | Admitting: Emergency Medicine

## 2016-07-15 ENCOUNTER — Emergency Department (HOSPITAL_COMMUNITY): Payer: Self-pay

## 2016-07-15 ENCOUNTER — Encounter (HOSPITAL_COMMUNITY): Payer: Self-pay | Admitting: Emergency Medicine

## 2016-07-15 ENCOUNTER — Encounter (HOSPITAL_COMMUNITY): Payer: Self-pay | Admitting: *Deleted

## 2016-07-15 ENCOUNTER — Ambulatory Visit (HOSPITAL_COMMUNITY)
Admission: EM | Admit: 2016-07-15 | Discharge: 2016-07-15 | Disposition: A | Discharge status: Home / Self Care | Payer: Self-pay | Attending: Family Medicine | Admitting: Family Medicine

## 2016-07-15 DIAGNOSIS — E1165 Type 2 diabetes mellitus with hyperglycemia: Secondary | ICD-10-CM | POA: Insufficient documentation

## 2016-07-15 DIAGNOSIS — E109 Type 1 diabetes mellitus without complications: Secondary | ICD-10-CM

## 2016-07-15 DIAGNOSIS — E139 Other specified diabetes mellitus without complications: Secondary | ICD-10-CM

## 2016-07-15 DIAGNOSIS — N390 Urinary tract infection, site not specified: Secondary | ICD-10-CM | POA: Insufficient documentation

## 2016-07-15 DIAGNOSIS — R739 Hyperglycemia, unspecified: Secondary | ICD-10-CM

## 2016-07-15 DIAGNOSIS — Z794 Long term (current) use of insulin: Secondary | ICD-10-CM | POA: Insufficient documentation

## 2016-07-15 HISTORY — DX: Pure hypercholesterolemia, unspecified: E78.00

## 2016-07-15 HISTORY — DX: Type 2 diabetes mellitus without complications: E11.9

## 2016-07-15 HISTORY — DX: Disorder of thyroid, unspecified: E07.9

## 2016-07-15 LAB — CBC
HEMATOCRIT: 40.8 % (ref 36.0–46.0)
Hemoglobin: 13.7 g/dL (ref 12.0–15.0)
MCH: 27 pg (ref 26.0–34.0)
MCHC: 33.6 g/dL (ref 30.0–36.0)
MCV: 80.5 fL (ref 78.0–100.0)
Platelets: 308 10*3/uL (ref 150–400)
RBC: 5.07 MIL/uL (ref 3.87–5.11)
RDW: 13.8 % (ref 11.5–15.5)
WBC: 5.8 10*3/uL (ref 4.0–10.5)

## 2016-07-15 LAB — BASIC METABOLIC PANEL
Anion gap: 7 (ref 5–15)
BUN: 17 mg/dL (ref 6–20)
CHLORIDE: 105 mmol/L (ref 101–111)
CO2: 24 mmol/L (ref 22–32)
Calcium: 9.5 mg/dL (ref 8.9–10.3)
Creatinine, Ser: 0.74 mg/dL (ref 0.44–1.00)
GFR calc Af Amer: >60 mL/min (ref >60)
GFR calc non Af Amer: >60 mL/min (ref >60)
Glucose, Bld: 431 mg/dL — ABNORMAL HIGH (ref 65–99)
POTASSIUM: 4.1 mmol/L (ref 3.5–5.1)
SODIUM: 136 mmol/L (ref 135–145)

## 2016-07-15 LAB — TSH: TSH: 0.217 u[IU]/mL — ABNORMAL LOW (ref 0.350–4.500)

## 2016-07-15 LAB — URINALYSIS, ROUTINE W REFLEX MICROSCOPIC
BACTERIA UA: NONE SEEN
BILIRUBIN URINE: NEGATIVE
Glucose, UA: >=500 mg/dL — AB
Hgb urine dipstick: NEGATIVE
KETONES UR: NEGATIVE mg/dL
Nitrite: NEGATIVE
Protein, ur: NEGATIVE mg/dL
SPECIFIC GRAVITY, URINE: 1.026 (ref 1.005–1.030)
pH: 5 (ref 5.0–8.0)

## 2016-07-15 LAB — CBG MONITORING, ED
GLUCOSE-CAPILLARY: 417 mg/dL — AB (ref 65–99)
Glucose-Capillary: 364 mg/dL — ABNORMAL HIGH (ref 65–99)

## 2016-07-15 LAB — I-STAT TROPONIN, ED: Troponin i, poc: 0.01 ng/mL (ref 0.00–0.08)

## 2016-07-15 LAB — GLUCOSE, CAPILLARY: Glucose-Capillary: 336 mg/dL — ABNORMAL HIGH (ref 65–99)

## 2016-07-15 MED ORDER — CEPHALEXIN 250 MG PO CAPS
500.0000 mg | ORAL_CAPSULE | Freq: Once | ORAL | Status: AC
  Administered 2016-07-16: 500 mg via ORAL
  Filled 2016-07-15: qty 2

## 2016-07-15 NOTE — ED Triage Notes (Signed)
Pt in from UC today for hyperglycemia, pt reports coming from [place] the pt normally takes Humolog but is out of medicine, pt needs prescription today, c/o HA, pt denies n/v/d, c/o fatigue, pts reported CBG 360 today, A&O x4, speaks Arabic

## 2016-07-15 NOTE — ED Provider Notes (Signed)
CSN: [id number]     Arrival date & time 07/15/16  1133 History   First MD Initiated Contact with Patient 07/15/16 1319     Chief Complaint  Patient presents with  . Medication Refill   (Consider location/radiation/quality/duration/timing/severity/associated sxs/prior Treatment) Patient diabetic.  Family unsure what medicine she was taking. The daughter states she ran out of her medicine yesterday.  Patient has no other complaints. Family states she was taking insulin.  Insulin type is unknown.   The history is provided by the patient and a relative.  Diabetes  This is a chronic problem. The current episode started yesterday. The problem occurs constantly. The problem has not changed since onset.   Past Medical History:  Diagnosis Date  . Diabetes mellitus without complication ([place])   . High cholesterol   . Thyroid disease    Past Surgical History:  Procedure Laterality Date  . EYE SURGERY     History reviewed. No pertinent family history. Social History  Substance Use Topics  . Smoking status: Never Smoker  . Smokeless tobacco: Not on file  . Alcohol use No   OB History    No data available     Review of Systems  Constitutional: Negative.   HENT: Negative.   Eyes: Negative.   Respiratory: Negative.   Cardiovascular: Negative.   Gastrointestinal: Negative.   Endocrine: Negative.   Genitourinary: Negative.   Musculoskeletal: Negative.   Allergic/Immunologic: Negative.   Neurological: Negative.   Hematological: Negative.   Psychiatric/Behavioral: Negative.     Allergies  Penicillins  Home Medications   Prior to Admission medications   Medication Sig Start Date End Date Taking? Authorizing Provider  OVER THE COUNTER MEDICATION Patient has run out of "insulin" that she [place].   Yes Historical Provider, MD   Meds Ordered and Administered this Visit  Medications - No data to display  BP 125/66 (BP Location: Left Arm)   Pulse  75   Temp 98 F (36.7 C) (Oral)   Resp 18   SpO2 100%  No data found.   Physical Exam  Constitutional: She appears well-developed and well-nourished.  HENT:  Head: Normocephalic and atraumatic.  Eyes: Pupils are equal, round, and reactive to light.  Neck: Normal range of motion. Neck supple.  Cardiovascular: Normal rate, regular rhythm and normal heart sounds.   Pulmonary/Chest: Effort normal and breath sounds normal.  Abdominal: Soft. Bowel sounds are normal.  Nursing note and vitals reviewed.   Urgent Care Course   Clinical Course     Procedures (including critical care time)  Labs Review Labs Reviewed - No data to display  Imaging Review No results found.   Visual Acuity Review  Right Eye Distance:   Left Eye Distance:   Bilateral Distance:    Right Eye Near:   Left Eye Near:    Bilateral Near:         MDM   1. Diabetes 1.5, managed as type 1 ([place])    Follow up with Primary Care Referral to PCP given  Recommend call office today If able to find out what type and how much diabetic  Medicine she was taking then can do refill for  Next 2 weeks.  I advise then to call PCP office today.      Lysbeth Penner, FNP 07/15/16 [place], [place] 07/15/16 [phone]

## 2016-07-15 NOTE — Care Management Note (Signed)
Case Management Note  Patient Details  Name: Rachel Dougherty MRN: [id number] Date of Birth: 19-Apr-1956  Subjective/Objective:                    Action/Plan: Patient presented to [facility] ED with elevated blood glucose. Patient recently moved to the area from [place] does not have insurance or a PCP.  CM discussed MATCH program and guidelines with patient and family member agreeable patient given [place] letter with instructions on how to redeem. Also [name] referral placed with Clinic CM to outreach to patient in the am. No further ED CM needs identified.  Expected Discharge Date:   07/15/2016               Expected Discharge Plan:  Home/Self Care  In-House Referral:     Discharge planning Services  CM Consult, [facility], [facility] Program  Post Acute Care Choice:    Choice offered to:  Patient  DME Arranged:  N/A DME Agency:  NA  HH Arranged:    [place] Agency:  NA  Status of Service:  Completed, signed off  If discussed at [employer] of Stay Meetings, dates discussed:    Additional CommentsLaurena Slimmer, RN 07/15/2016, 11:07 PM

## 2016-07-15 NOTE — ED Notes (Addendum)
Asked pt for urine sample. Pt asked for permission to drink water.

## 2016-07-15 NOTE — ED Provider Notes (Signed)
[facility] DEPT Provider Note   CSN: [id number] Arrival date & time: 07/15/16  1507     History   Chief Complaint Chief Complaint  Patient presents with  . Hyperglycemia    HPI Rachel Dougherty is a 61 y.o. female.  HPI 61 year old female who presents With hyperglycemia. History is obtained from patient's sister who states that the patient recently moved from [place] in December. She has a history of diabetes, hyperlipidemia and thyroid disease. Ran out of her insulin yesterday morning, and later on that evening had generalized weakness and fatigue. Tried to follow-up at [facility] but unable to get appointment and directed to ED. No fever or chills, n/v/d, abdominal pain. No cough, dyspnea. Occasionally over past few months has had occasional twinge of chest wall pain, sometimes when she turns her body in certain directions.     Past Medical History:  Diagnosis Date  . Diabetes mellitus without complication ([place])   . High cholesterol   . Thyroid disease     There are no active problems to display for this patient.   Past Surgical History:  Procedure Laterality Date  . CESAREAN SECTION    . EYE SURGERY      OB History    No data available       Home Medications    Prior to Admission medications   Medication Sig Start Date End Date Taking? Authorizing Provider  cephALEXin (KEFLEX) 500 MG capsule Take 1 capsule (500 mg total) by mouth 2 (two) times daily. 07/16/16 07/23/16  Forde Dandy, MD  insulin NPH-regular Human (NOVOLIN 70/30) (70-30) 100 UNIT/ML injection Take 50 units subcutaneous in the morning with meal and 30 units subcutaneous in the evening with meal 07/16/16   Forde Dandy, MD    Family History Reviewed, non-contributory  Social History Social History  Substance Use Topics  . Smoking status: Never Smoker  . Smokeless tobacco: Not on file  . Alcohol use No     Allergies   Penicillins   Review of Systems Review of  Systems 10/14 systems reviewed and are negative other than those stated in the HPI  Physical Exam Updated Vital Signs BP 153/80   Pulse 69   Temp 97.9 F (36.6 C) (Oral)   Resp 13   Ht 5' (1.524 m)   Wt 157 lb 2 oz (71.3 kg)   SpO2 99%   BMI 30.69 kg/m   Physical Exam Physical Exam  Nursing note and vitals reviewed. Constitutional: Well developed, well nourished, non-toxic, and in no acute distress Head: Normocephalic and atraumatic.  Mouth/Throat: Oropharynx is clear and moist.  Neck: Normal range of motion. Neck supple.  Cardiovascular: Normal rate and regular rhythm.  no edema. Pulmonary/Chest: Effort normal and breath sounds normal. mild chest wall pain to palpation over bilateral lower ribs. Abdominal: Soft. There is mild suprapubic discomfort. There is no rebound and no guarding.  Musculoskeletal: Normal range of motion.  Neurological: Alert, no facial droop, fluent speech, moves all extremities symmetrically Skin: Skin is warm and dry.  Psychiatric: Cooperative   ED Treatments / Results  Labs (all labs ordered are listed, but only abnormal results are displayed) Labs Reviewed  BASIC METABOLIC PANEL - Abnormal; Notable for the following:       Result Value   Glucose, Bld 431 (*)    All other components within normal limits  URINALYSIS, ROUTINE W REFLEX MICROSCOPIC - Abnormal; Notable for the following:    Color,  Urine STRAW (*)    Glucose, UA >=500 (*)    Leukocytes, UA MODERATE (*)    Squamous Epithelial / LPF 0-5 (*)    All other components within normal limits  TSH - Abnormal; Notable for the following:    TSH 0.217 (*)    All other components within normal limits  CBG MONITORING, ED - Abnormal; Notable for the following:    Glucose-Capillary 417 (*)    All other components within normal limits  CBG MONITORING, ED - Abnormal; Notable for the following:    Glucose-Capillary 364 (*)    All other components within normal limits  URINE CULTURE  CBC  T4,  FREE  I-STAT TROPOININ, ED    EKG  EKG Interpretation  Date/Time:  Tuesday July 15 2016 23:12:43 EST Ventricular Rate:  74 PR Interval:    QRS Duration: 88 QT Interval:  403 QTC Calculation: 448 R Axis:   156 Text Interpretation:  Sinus rhythm Right axis deviation lateral TWI  no prior EKG  Confirmed by Esiah Bazinet MD, Sharena Dibenedetto [phone]) on 07/15/2016 11:39:18 PM       Radiology Dg Chest 2 View  Result Date: 07/15/2016 CLINICAL DATA:  Lower chest wall pain, diabetes EXAM: CHEST  2 VIEW COMPARISON:  None. FINDINGS: The heart size and mediastinal contours are within normal limits. Both lungs are clear. The visualized skeletal structures are unremarkable. IMPRESSION: No active cardiopulmonary disease. Electronically Signed   By: Ashley Royalty M.D.   On: 07/15/2016 22:56    Procedures Procedures (including critical care time)  Medications Ordered in ED Medications  cephALEXin (KEFLEX) capsule 500 mg (500 mg Oral Given 07/16/16 0025)     Initial Impression / Assessment and Plan / ED Course  I have reviewed the triage vital signs and the nursing notes.  Pertinent labs & imaging results that were available during my care of the patient were reviewed by me and considered in my medical decision making (see chart for details).   Presenting with fatigue and weakness for one day in setting of running out of her insulin. She does have initial hypoglycemia in the ED, blood work does not show evidence of DKA. UA does show evidence of UTI and we'll treat with course of Keflex. Remainder blood work unremarkable without major electrolyte or metabolic derangements. Low TSH but a normal free T4.  Discussed with case management who has arranged follow-up for patient with [facility]. Prescriptions given for insulin-dependent Keflex. [place] card also provided to patient by case management.  Strict return and follow-up instructions reviewed. She expressed understanding of all discharge  instructions and felt comfortable with the plan of care.   Final Clinical Impressions(s) / ED Diagnoses   Final diagnoses:  Hyperglycemia  Lower urinary tract infectious disease    New Prescriptions Discharge Medication List as of 07/16/2016 12:51 AM    START taking these medications   Details  cephALEXin (KEFLEX) 500 MG capsule Take 1 capsule (500 mg total) by mouth 2 (two) times daily., Starting Wed 07/16/2016, Until Wed 07/23/2016, Print    insulin NPH-regular Human (NOVOLIN 70/30) (70-30) 100 UNIT/ML injection Take 50 units subcutaneous in the morning with meal and 30 units subcutaneous in the evening with meal, Print         Forde Dandy, MD 07/16/16 [phone]

## 2016-07-15 NOTE — ED Triage Notes (Signed)
[place]  No pcp.  Last time medicine taken was yesterday, none for today

## 2016-07-16 ENCOUNTER — Encounter: Payer: Self-pay | Admitting: Ophthalmology

## 2016-07-16 ENCOUNTER — Telehealth: Payer: Self-pay

## 2016-07-16 LAB — T4, FREE: FREE T4: 0.86 ng/dL (ref 0.61–1.12)

## 2016-07-16 MED ORDER — INSULIN NPH ISOPHANE & REGULAR (70-30) 100 UNIT/ML ~~LOC~~ SUSP: SUBCUTANEOUS | 11 refills | Status: DC

## 2016-07-16 MED ORDER — CEPHALEXIN 500 MG PO CAPS: 500.0000 mg | ORAL_CAPSULE | Freq: Two times a day (BID) | ORAL | 0 refills | Status: DC

## 2016-07-16 NOTE — Discharge Instructions (Signed)
Please take medications as prescribed. You're given antibiotics for urinary tract infection. Please follow-up with primary care physician above. Please return without fail for worsening symptoms, including fever, confusion, intractable vomiting, severe pain, or any other symptoms concerning to you.

## 2016-07-16 NOTE — Telephone Encounter (Signed)
This Case Manager received communication from Laurena Slimmer, ED RN CM that patient needing follow-up appointment. Patient recently moved from [place] and presented to ED on 07/15/16 with hyperglycemia. Patient uninsured and does not have a PCP. Call placed to patient using Geophysical data processor (Interpreter # [phone]). Patient agreeable to appointment at [facility]. Appointment scheduled for 07/18/16 at 1530. Patient informed of clinic address, phone number, and onsite services. No additional needs identified.

## 2016-07-17 LAB — URINE CULTURE

## 2016-07-17 MED FILL — !NOVOLIN 70/30 100 UNITS/ML: (70-30) 100 | 13 days supply | Qty: 10 | Fill #0

## 2016-07-17 MED FILL — CEPHALEXIN 500 MG CAPSULE: 500 | 7 days supply | Qty: 14 | Fill #0

## 2016-07-18 ENCOUNTER — Ambulatory Visit: Payer: Self-pay | Attending: Internal Medicine | Admitting: Physician Assistant

## 2016-07-18 ENCOUNTER — Encounter: Payer: Self-pay | Admitting: Physician Assistant

## 2016-07-18 VITALS — BP 160/74 | HR 98 | Temp 97.8°F | Resp 16 | Wt 156.6 lb

## 2016-07-18 DIAGNOSIS — E079 Disorder of thyroid, unspecified: Secondary | ICD-10-CM

## 2016-07-18 DIAGNOSIS — E118 Type 2 diabetes mellitus with unspecified complications: Secondary | ICD-10-CM

## 2016-07-18 DIAGNOSIS — Z794 Long term (current) use of insulin: Secondary | ICD-10-CM | POA: Insufficient documentation

## 2016-07-18 DIAGNOSIS — E7800 Pure hypercholesterolemia, unspecified: Secondary | ICD-10-CM | POA: Insufficient documentation

## 2016-07-18 DIAGNOSIS — Z880 Allergy status to penicillin: Secondary | ICD-10-CM | POA: Insufficient documentation

## 2016-07-18 DIAGNOSIS — E0590 Thyrotoxicosis, unspecified without thyrotoxic crisis or storm: Secondary | ICD-10-CM | POA: Insufficient documentation

## 2016-07-18 DIAGNOSIS — E1165 Type 2 diabetes mellitus with hyperglycemia: Secondary | ICD-10-CM | POA: Insufficient documentation

## 2016-07-18 LAB — GLUCOSE, POCT (MANUAL RESULT ENTRY)
POC GLUCOSE: 333 mg/dL — AB (ref 70–99)
POC GLUCOSE: 342 mg/dL — AB (ref 70–99)
POC Glucose: 307 mg/dl — AB (ref 70–99)

## 2016-07-18 LAB — POCT GLYCOSYLATED HEMOGLOBIN (HGB A1C): Hemoglobin A1C: 12

## 2016-07-18 MED ORDER — NITROFURANTOIN MONOHYD MACRO 100 MG PO CAPS: 100.0000 mg | ORAL_CAPSULE | Freq: Two times a day (BID) | ORAL | 0 refills | Status: AC

## 2016-07-18 MED ORDER — INSULIN NPH ISOPHANE & REGULAR (70-30) 100 UNIT/ML ~~LOC~~ SUSP: SUBCUTANEOUS | 11 refills | Status: DC

## 2016-07-18 MED ORDER — INSULIN ASPART 100 UNIT/ML ~~LOC~~ SOLN
10.0000 [IU] | Freq: Once | SUBCUTANEOUS | Status: AC
  Administered 2016-07-18: 10 [IU] via SUBCUTANEOUS

## 2016-07-18 MED ORDER — GLUCOSE BLOOD VI STRP: ORAL_STRIP | 12 refills | Status: AC

## 2016-07-18 MED ORDER — TRUEPLUS LANCETS 28G MISC: 3 refills | Status: DC

## 2016-07-18 MED ORDER — TRUE METRIX METER W/DEVICE KIT: PACK | 0 refills | Status: DC

## 2016-07-18 MED ORDER — METFORMIN HCL 500 MG PO TABS: 500.0000 mg | ORAL_TABLET | Freq: Two times a day (BID) | ORAL | 5 refills | Status: DC

## 2016-07-18 MED FILL — TRUEplus LANCETS 28G MISC: 30 days supply | Qty: 100 | Fill #0

## 2016-07-18 MED FILL — metFORMIN HCL 500 MG TABS: 500 | 30 days supply | Qty: 60 | Fill #0

## 2016-07-18 MED FILL — NITROFURANTOIN MONO-MCR 100: 100 | 5 days supply | Qty: 10 | Fill #0

## 2016-07-18 MED FILL — TRUE METRIX BLOOD GLUCOSE M: W/DEVICE | 1 days supply | Qty: 1 | Fill #0

## 2016-07-18 MED FILL — TRUE METRIX TEST STRIP: 30 days supply | Qty: 100 | Fill #0

## 2016-07-18 NOTE — Patient Instructions (Addendum)
Check blood sugar fasting and at meals dai;ly and record and bring in with you to your next office visit.  Hold insulin if blood sugar is <100.  Make sure and drink a lot of water.  Decrease your sugar intake.  Do not take Cephalexin.  I have sent you a different antibiotic to the pharmacy instead.

## 2016-07-18 NOTE — Progress Notes (Signed)
Rachel Dougherty, is a 61 y.o. female  VCB:[id number]  [id number]:[id number]  DOB - 06/24/1956  Subjective:  Chief Complaint and HPI: Rachel Dougherty is a 61 y.o. female here today to establish care and for a follow up visit after being seen in the ED 07/15/2016 for uncontrolled diabetes.  She recently moved here from [place](but she has been in [place] before a few years ago).  They refused interpreters and her sister is translating.  It sounds as though she has had diabetes for years.  They say she was only out of her medications for 1 day.  She was diagnosed with a UTI at the hospital and her Novolog was prescribed.  She just picked up her prescription for the insulin and for the keflex.  She says she is feeling ok/well today.  She also has a h/o hyperlipidemia and thyroid disease.  She needs a prescription for a glucometer.  She is not currently checking her blood sugars and does not have any readings with her.     ED/Hospital notes reviewed.     ROS:   Constitutional:  No f/c, No night sweats, No unexplained weight loss. EENT:  No vision changes, No blurry vision, No hearing changes. No mouth, throat, or ear problems.  Respiratory: No cough, No SOB Cardiac: No CP, no palpitations GI:  No abd pain, No N/V/D. GU: No Urinary s/sx Musculoskeletal: No joint pain Neuro: No headache, no dizziness, no motor weakness.  Skin: No rash Endocrine:  No polydipsia. No polyuria.  Psych: Denies SI/HI  Problem  Thyroid Condition    ALLERGIES: Allergies  Allergen Reactions  . Penicillins Rash  . Penicillins Rash    Has patient had a PCN reaction causing immediate rash, facial/tongue/throat swelling, SOB or lightheadedness with hypotension: YES Has patient had a PCN reaction causing severe rash involving mucus membranes or skin necrosis: NO Has patient had a PCN reaction that required hospitalization. NO Has patient had a PCN reaction occurring within the last 10 years: NO If all of the above answers are  "NO", then may proceed with Cephalosporin use.    PAST MEDICAL HISTORY: Past Medical History:  Diagnosis Date  . Diabetes mellitus type II   . Diabetes mellitus without complication ([place])   . Fibroid uterus   . High cholesterol   . Hypercholesteremia   . Hyperthyroidism   . Maternal blood transfusion    after miscarriage  . Thyroid disease     MEDICATIONS AT HOME: Prior to Admission medications   Medication Sig Start Date End Date Taking? Authorizing Provider  cephALEXin (KEFLEX) 500 MG capsule Take 1 capsule (500 mg total) by mouth 2 (two) times daily. 07/16/16 07/23/16 Yes Forde Dandy, MD  fluticasone Asencion Islam) 50 MCG/ACT nasal spray 2 spray in each nostril daily 07/27/11  Yes Adlih Moreno-Coll, MD  insulin NPH-regular Human (NOVOLIN 70/30) (70-30) 100 UNIT/ML injection Take 50 units subcutaneous in the morning with meal and 30 units subcutaneous in the evening with meal 07/16/16  Yes Forde Dandy, MD  Blood Glucose Monitoring Suppl (TRUE METRIX METER) w/Device KIT Check blood sugar fasting and at meals and record 07/18/16   Argentina Donovan, PA-C  glucose blood (TRUE METRIX BLOOD GLUCOSE TEST) test strip Use as instructed 07/18/16   Argentina Donovan, PA-C  metFORMIN (GLUCOPHAGE) 500 MG tablet Take 1 tablet (500 mg total) by mouth 2 (two) times daily with a meal. Take as directed in your discharge instructions. 07/18/16   Argentina Donovan, PA-C  pravastatin (PRAVACHOL) 20 MG tablet Take 20 mg by mouth every evening.   05/22/11 05/21/12  Thera Flake, MD  TRUEPLUS LANCETS 28G MISC Use for checking blood sugar fasting and at meal time 07/18/16   Argentina Donovan, PA-C     Objective:  EXAM:   Vitals:   07/18/16 1553  BP: 127/79  Pulse: 80  Resp: 16  Temp: 97.8 F (36.6 C)  TempSrc: Oral  SpO2: 100%  Weight: 156 lb 9.6 oz (71 kg)    General appearance : A&OX3. NAD. Non-toxic-appearing HEENT: Atraumatic and Normocephalic.  PERRLA. EOM intact.  TM clear B. Mouth-MMM, post pharynx WNL  w/o erythema, No PND. Neck: supple, no JVD. No cervical lymphadenopathy. No thyromegaly Chest/Lungs:  Breathing-non-labored, Good air entry bilaterally, breath sounds normal without rales, rhonchi, or wheezing  CVS: S1 S2 regular, no murmurs, gallops, rubs  Extremities: Bilateral Lower Ext shows no edema, both legs are warm to touch with = pulse throughout Neurology:  CN II-XII grossly intact, Non focal.   Psych:  TP linear. J/I WNL. Normal speech. Appropriate eye contact and affect.  Skin:  No Rash  Data Review Lab Results  Component Value Date   HGBA1C 8.4 08/29/2011   HGBA1C 12.0 05/22/2011   HGBA1C 9.4 (H) 04/10/2008     Assessment & Plan   1. Type 2 diabetes mellitus with complication, with long-term current use of insulin (HCC) Uncontrolled. A1C=12.0 today - POCT glucose (manual entry) - POCT glycosylated hemoglobin (Hb A1C) - insulin aspart (novoLOG) injection 10 Units; Inject 0.1 mLs (10 Units total) into the skin once. Restart- metFORMIN (GLUCOPHAGE) 500 MG tablet; Take 1 tablet (500 mg total) by mouth 2 (two) times daily with a meal. Take as directed in your discharge instructions.  Dispense: 60 tablet; Refill: 5 - Blood Glucose Monitoring Suppl (TRUE METRIX METER) w/Device KIT; Check blood sugar fasting and at meals and record  Dispense: 1 kit; Refill: 0 - glucose blood (TRUE METRIX BLOOD GLUCOSE TEST) test strip; Use as instructed  Dispense: 100 each; Refill: 12 - TRUEPLUS LANCETS 28G MISC; Use for checking blood sugar fasting and at meal time  Dispense: 100 each; Refill: 3 Start your Novolog. Check blood sugar fasting and at meals dai;ly and record and bring in with you to your next office visit.  Hold insulin if blood sugar is <150.  Make sure and drink a lot of water.  Decrease your sugar intake.  2. Thyroid condition 07/15/2016 TSH=0.217 Free T4=0.86 No medications will be resumed or restarted at this time.  We will monitor for symptoms and levels for changes  regularly.  Do not take Cephalexin(due to PCN reaction).  I have sent you a different antibiotic to the pharmacy instead. 4:45pm-Patient slipped as she was getting off the exam table.  She says she lost her footing and slipped.  She didn't feel weak or dizzy or faint.  She did not hit her head.  She landed on her buttocks.  Denies any pain.  VS stable and as recorded in the chart.  Normal neuro exam. No musculoskeletal injury/swelling/disability.  Lungs CTA B.  Heart RRR. CBG at 333 immediately after slipping. Patient ambulates and is able to get up without assistance. Patient was discharged without further incident.  Patient have been counseled extensively about nutrition and exercise  Return in about 2 weeks (around 08/01/2016) for establish with PCP; diabetic management.  The patient was given clear instructions to go to ER or return to medical center if symptoms don't  improve, worsen or new problems develop. The patient verbalized understanding. The patient was told to call to get lab results if they haven't heard anything in the next week.     Freeman Caldron, PA-C [facility] and [facility] [place], [place]   07/18/2016, 4:21 PMPatient ID: Irish Elders, female   DOB: 11/25/1955, 61 y.o.   MRN: [id number]

## 2016-07-30 ENCOUNTER — Ambulatory Visit: Payer: Self-pay | Admitting: Family Medicine

## 2016-07-30 ENCOUNTER — Ambulatory Visit: Payer: Self-pay

## 2016-08-01 MED FILL — !NOVOLIN 70/30 100 UNITS/ML: (70-30) 100 | 13 days supply | Qty: 10 | Fill #1

## 2016-08-11 ENCOUNTER — Ambulatory Visit: Payer: Self-pay | Attending: Family Medicine

## 2016-08-11 ENCOUNTER — Other Ambulatory Visit: Payer: Self-pay | Admitting: Physician Assistant

## 2016-08-11 ENCOUNTER — Ambulatory Visit (HOSPITAL_BASED_OUTPATIENT_CLINIC_OR_DEPARTMENT_OTHER): Payer: Self-pay | Admitting: Family Medicine

## 2016-08-11 VITALS — BP 115/72 | HR 76 | Temp 98.3°F | Resp 18 | Ht 60.0 in | Wt 163.2 lb

## 2016-08-11 DIAGNOSIS — Z794 Long term (current) use of insulin: Secondary | ICD-10-CM

## 2016-08-11 DIAGNOSIS — E1142 Type 2 diabetes mellitus with diabetic polyneuropathy: Secondary | ICD-10-CM

## 2016-08-11 DIAGNOSIS — E118 Type 2 diabetes mellitus with unspecified complications: Secondary | ICD-10-CM

## 2016-08-11 DIAGNOSIS — H43392 Other vitreous opacities, left eye: Secondary | ICD-10-CM

## 2016-08-11 DIAGNOSIS — Z23 Encounter for immunization: Secondary | ICD-10-CM

## 2016-08-11 DIAGNOSIS — H547 Unspecified visual loss: Secondary | ICD-10-CM

## 2016-08-11 DIAGNOSIS — Z0000 Encounter for general adult medical examination without abnormal findings: Secondary | ICD-10-CM

## 2016-08-11 DIAGNOSIS — E1165 Type 2 diabetes mellitus with hyperglycemia: Secondary | ICD-10-CM | POA: Insufficient documentation

## 2016-08-11 LAB — LIPID PANEL
CHOL/HDL RATIO: 7.8 ratio — AB (ref <5.0)
CHOLESTEROL: 258 mg/dL — AB (ref <200)
HDL: 33 mg/dL — ABNORMAL LOW (ref >50)
LDL CALC: 170 mg/dL — AB (ref <100)
Triglycerides: 275 mg/dL — ABNORMAL HIGH (ref <150)
VLDL: 55 mg/dL — AB (ref <30)

## 2016-08-11 LAB — GLUCOSE, POCT (MANUAL RESULT ENTRY): POC GLUCOSE: 166 mg/dL — AB (ref 70–99)

## 2016-08-11 MED ORDER — INSULIN SYRINGES (DISPOSABLE) U-100 1 ML MISC: 1.0000 | Freq: Once | 11 refills | Status: AC

## 2016-08-11 MED ORDER — INSULIN NPH ISOPHANE & REGULAR (70-30) 100 UNIT/ML ~~LOC~~ SUSP: SUBCUTANEOUS | 11 refills | Status: AC

## 2016-08-11 MED ORDER — METFORMIN HCL 1000 MG PO TABS: 1000.0000 mg | ORAL_TABLET | Freq: Two times a day (BID) | ORAL | 2 refills | Status: AC

## 2016-08-11 MED ORDER — GABAPENTIN 300 MG PO CAPS: 300.0000 mg | ORAL_CAPSULE | Freq: Two times a day (BID) | ORAL | 2 refills | Status: DC

## 2016-08-11 MED ORDER — TRUEPLUS LANCETS 28G MISC: 11 refills | Status: AC

## 2016-08-11 MED FILL — TRUE METRIX TEST STRIP: 30 days supply | Qty: 100 | Fill #1

## 2016-08-11 MED FILL — TRUEplus LANCETS 28G MISC: 33 days supply | Qty: 100 | Fill #0

## 2016-08-11 MED FILL — GABAPENTIN 300 MG CAPSULE: 300 | 30 days supply | Qty: 60 | Fill #0

## 2016-08-11 NOTE — Progress Notes (Signed)
Patient is here estab care diabetes  Patient have eaten today  Patient has not taking her meds today  Patient need refills on insulin injections, metformin, test strips  Patient complains about being weak, fatigue and tired almost everyday

## 2016-08-11 NOTE — Patient Instructions (Signed)
Come back in 2 weeks for Diabetes follow up with clinical pharmacist.   Type 2 Diabetes Mellitus, Diagnosis, Adult Type 2 diabetes (type 2 diabetes mellitus) is a long-term (chronic) disease. It may be caused by one or both of these problems:  Your body does not make enough of a hormone called insulin.  Your body does not react in a normal way to insulin that it makes. Insulin lets sugars (glucose) go into cells in the body. This gives you energy. If you have type 2 diabetes, sugars cannot get into cells. This causes high blood sugar (hyperglycemia). Your doctor will set treatment goals for you. Generally, you should have these blood sugar levels:  Before meals (preprandial): 80-130 mg/dL (4.4-7.2 mmol/L).  After meals (postprandial): below 180 mg/dL (10 mmol/L).  A1c (hemoglobin A1c) level: less than 7%. Follow these instructions at home: Questions to Ask Your Doctor  You may want to ask these questions:  Do I need to meet with a diabetes educator?  Where can I find a support group for people with diabetes?  What equipment will I need to care for myself at home?  What diabetes medicines do I need? When should I take them?  How often do I need to check my blood sugar?  What number can I call if I have questions?  When is my next doctor's visit? General instructions  Take over-the-counter and prescription medicines only as told by your doctor.  Keep all follow-up visits as told by your doctor. This is important. Contact a doctor if:  Your blood sugar is at or above 240 mg/dL (13.3 mmol/L) for 2 days in a row.  You have been sick or have had a fever for 2 days or more and you are not getting better.  You have any of these problems for more than 6 hours:  You cannot eat or drink.  You feel sick to your stomach (nauseous).  You throw up (vomit).  You have watery poop (diarrhea). Get help right away if:  Your blood sugar is lower than 54 mg/dL (3 mmol/L).  You get  confused.  You have trouble:  Thinking clearly.  Breathing.  You have moderate or large ketone levels in your pee (urine). This information is not intended to replace advice given to you by your health care provider. Make sure you discuss any questions you have with your health care provider. Document Released: 04/08/2008 Document Revised: 12/06/2015 Document Reviewed: 08/03/2015 Elsevier Interactive Patient Education  2017 [employer].

## 2016-08-11 NOTE — Progress Notes (Signed)
Subjective:  Patient ID: Rachel Dougherty, female    DOB: 08/25/1955  Age: 61 y.o. MRN: [id number]  CC: Diabetes   HPI Shuntavia Merghani Elbushra Stordahl presents for follow up of diabetes. Symptoms: hyperglycemia, paresthesia of the feet and visual disturbances. She reports symptoms of numbness to the feet for at least 1 year. She reports left eye pain and floaters. Reports history of retina surgery in her country but is unable to explain what surgery was for. She denies any severe headaches, N/V, halos around light, or eye redness. She reports  Symptoms have been intermittent. Patient denies foot ulcerations and hypoglycemia . Evaluation to date has been included: fasting blood sugar and hemoglobin A1C.  She reports home blood glucose: BGs range between 100's and 250's the last 2 weeks. She reports checking CBG TID. Blood sugar log reflect blood sugars are not being documented correctly.  Treatment to date: Continued insulin which has been somewhat effective.   Outpatient Medications Prior to Visit  Medication Sig Dispense Refill  . Blood Glucose Monitoring Suppl (TRUE METRIX METER) w/Device KIT Check blood sugar fasting and at meals and record 1 kit 0  . glucose blood (TRUE METRIX BLOOD GLUCOSE TEST) test strip Use as instructed 100 each 12  . insulin NPH-regular Human (NOVOLIN 70/30) (70-30) 100 UNIT/ML injection Take 50 units subcutaneous in the morning with meal and 30 units subcutaneous in the evening with meal 10 mL 11  . metFORMIN (GLUCOPHAGE) 500 MG tablet Take 1 tablet (500 mg total) by mouth 2 (two) times daily with a meal. Take as directed in your discharge instructions. 60 tablet 5  . TRUEPLUS LANCETS 28G MISC Use for checking blood sugar fasting and at meal time 100 each 3  . fluticasone (FLONASE) 50 MCG/ACT nasal spray 2 spray in each nostril daily (Patient not taking: Reported on 08/11/2016) 16 g 0  . nitrofurantoin, macrocrystal-monohydrate, (MACROBID) 100 MG capsule Take  1 capsule (100 mg total) by mouth 2 (two) times daily. 10 capsule 0  . pravastatin (PRAVACHOL) 20 MG tablet Take 20 mg by mouth every evening.       No facility-administered medications prior to visit.    ROS Review of Systems  Eyes: Positive for pain (left) and visual disturbance.  Respiratory: Negative.   Cardiovascular: Negative.   Gastrointestinal: Negative.   Endocrine: Negative.   Neurological: Positive for numbness.   Objective:  BP 115/72 (BP Location: Left Arm, Patient Position: Sitting, Cuff Size: Normal)   Pulse 76   Temp 98.3 F (36.8 C) (Oral)   Resp 18   Ht 5' (1.524 m)   Wt 163 lb 3.2 oz (74 kg)   LMP 05/21/2010   SpO2 99%   BMI 31.87 kg/m   BP/Weight 08/11/2016 07/18/7827 11/16/2128  Systolic BP 865 784 696  Diastolic BP 72 74 80  Wt. (Lbs) 163.2 156.6 -  BMI 31.87 30.58 -    Physical Exam  Constitutional: She is oriented to person, place, and time.  Eyes: Conjunctivae and EOM are normal. Pupils are equal, round, and reactive to light.   visual acuity screen  Cardiovascular: Normal rate, regular rhythm, normal heart sounds and intact distal pulses.   Pulmonary/Chest: Effort normal and breath sounds normal.  Abdominal: Soft. Bowel sounds are normal.  Neurological: She is alert and oriented to person, place, and time.  Reflex Scores:      Patellar reflexes are 2+ on the right side and 2+ on the left side. Skin: Skin is warm  and dry.  Monofilament foot exam is wnl.   Nursing note and vitals reviewed.  Assessment & Plan:   Problem List Items Addressed This Visit      Endocrine   Diabetic peripheral neuropathy associated with type 2 diabetes mellitus (HCC) - Primary   Relevant Medications   metFORMIN (GLUCOPHAGE) 1000 MG tablet   insulin NPH-regular Human (NOVOLIN 70/30) (70-30) 100 UNIT/ML injection   gabapentin (NEURONTIN) 300 MG capsule   Other Relevant Orders   Glucose (CBG) (Completed)    Other Visit Diagnoses    Type 2 diabetes mellitus with  complication, with long-term current use of insulin ([place])       -Explained to patient and her sister how to properly use document CBG's in log.   Relevant Medications   metFORMIN (GLUCOPHAGE) 1000 MG tablet   insulin NPH-regular Human (NOVOLIN 70/30) (70-30) 100 UNIT/ML injection   TRUEPLUS LANCETS 28G MISC   Other Relevant Orders   Ambulatory referral to Ophthalmology   Lipid Panel (Completed)   Microalbumin/Creatinine Ratio, Urine (Completed)   Decreased visual acuity       -Vision acuity screen.   -Asked patient and her sister to bring back paperwork to complete referral process.   Relevant Orders   Ambulatory referral to Ophthalmology   Floaters in visual field, left       Relevant Orders   Ambulatory referral to Ophthalmology   Healthcare maintenance       Relevant Orders   Tdap vaccine greater than or equal to 7yo IM (Completed)   Hepatitis C Antibody (Completed)     Meds ordered this encounter  Medications  . metFORMIN (GLUCOPHAGE) 1000 MG tablet    Sig: Take 1 tablet (1,000 mg total) by mouth 2 (two) times daily with a meal. Take as directed in your discharge instructions.    Dispense:  120 tablet    Refill:  2    Order Specific Question:   Supervising Provider    Answer:   Tresa Garter [id number]  . insulin NPH-regular Human (NOVOLIN 70/30) (70-30) 100 UNIT/ML injection    Sig: Take 50 units subcutaneous in the morning with meal and 30 units subcutaneous in the evening with meal    Dispense:  10 mL    Refill:  11    Order Specific Question:   Supervising Provider    Answer:   Tresa Garter [[id number]]  . Insulin Syringes, Disposable, (B-D INSULIN SYRINGE 1CC) U-100 1 ML MISC    Sig: 1 kit by Does not apply route once.    Dispense:  100 each    Refill:  11    Order Specific Question:   Supervising Provider    Answer:   Tresa Garter [id number]  . TRUEPLUS LANCETS 28G MISC    Sig: Use for checking blood sugar fasting and at meal time    Dispense:   100 each    Refill:  11    Order Specific Question:   Supervising Provider    Answer:   Tresa Garter [[id number]]  . gabapentin (NEURONTIN) 300 MG capsule    Sig: Take 1 capsule (300 mg total) by mouth 2 (two) times daily.    Dispense:  60 capsule    Refill:  2    Order Specific Question:   Supervising Provider    Answer:   Tresa Garter [[id number]]    Follow-up: Return in about 2 weeks (around 08/25/2016) for Diabetes follow up with clinical pharmacist.   Toy Baker  Sandria Bales FNP

## 2016-08-12 ENCOUNTER — Other Ambulatory Visit: Payer: Self-pay | Admitting: Family Medicine

## 2016-08-12 LAB — MICROALBUMIN / CREATININE URINE RATIO
CREATININE, URINE: 70 mg/dL (ref 20–320)
MICROALB UR: 1 mg/dL
MICROALB/CREAT RATIO: 14 ug/mg{creat} (ref <30)

## 2016-08-12 LAB — HEPATITIS C ANTIBODY: HCV Ab: NEGATIVE

## 2016-08-14 MED FILL — !NOVOLIN 70/30 100 UNITS/ML: (70-30) 100 | 13 days supply | Qty: 10 | Fill #0

## 2016-08-14 MED FILL — ?METFORMIN HCL 1,000 MG TAB: 1000 | 30 days supply | Qty: 60 | Fill #0

## 2016-08-18 ENCOUNTER — Other Ambulatory Visit: Payer: Self-pay | Admitting: Family Medicine

## 2016-08-18 DIAGNOSIS — E782 Mixed hyperlipidemia: Secondary | ICD-10-CM

## 2016-08-18 MED ORDER — ASPIRIN EC 81 MG PO TBEC: 81.0000 mg | DELAYED_RELEASE_TABLET | Freq: Every day | ORAL | 2 refills | Status: DC

## 2016-08-18 MED ORDER — ATORVASTATIN CALCIUM 20 MG PO TABS: 20.0000 mg | ORAL_TABLET | Freq: Every day | ORAL | 2 refills | Status: DC

## 2016-08-19 ENCOUNTER — Telehealth: Payer: Self-pay | Admitting: *Deleted

## 2016-08-19 NOTE — Telephone Encounter (Signed)
-----   Message from Alfonse Spruce, [place] sent at 08/18/2016  8:31 AM EST ----- -Hepatitis C is negative.  -Microalbumin/creatinine ratio level was normal. This tests for protein in your urine that can indicate early signs of kidney damage.  -Lipid levels were elevated. This can increase your risk of heart disease. You were prescribed atorvastatin and aspirin to help lower your risk. Follow up in 3 months for lipids.

## 2016-09-04 MED FILL — metFORMIN HCL 500 MG TABS: 500 | 30 days supply | Qty: 60 | Fill #1

## 2016-09-04 MED FILL — ATORVASTATIN 20 MG TABLET: 20 | 30 days supply | Qty: 30 | Fill #0

## 2016-09-04 MED FILL — NOVOLIN 70/30 100 UNITS/ML: (70-30) 100 | 13 days supply | Qty: 10 | Fill #1

## 2016-09-04 NOTE — Telephone Encounter (Signed)
Medical Assistant used [employer] [employer] to contact patient.  Interpreter Name: Michaell Cowing Interpreter #: [id number] Patient verified DOB

## 2016-09-04 NOTE — Telephone Encounter (Signed)
-----   Message from Alfonse Spruce, [place] sent at 08/18/2016  8:31 AM EST ----- -Hepatitis C is negative.  -Microalbumin/creatinine ratio level was normal. This tests for protein in your urine that can indicate early signs of kidney damage.  -Lipid levels were elevated. This can increase your risk of heart disease. You were prescribed atorvastatin and aspirin to help lower your risk. Follow up in 3 months for lipids.

## 2016-09-30 MED FILL — NOVOLIN 70/30 100 UNITS/ML: (70-30) 100 | 13 days supply | Qty: 10 | Fill #2

## 2016-09-30 MED FILL — TRUE METRIX TEST STRIP: 30 days supply | Qty: 100 | Fill #2

## 2016-09-30 MED FILL — TRUEplus LANCETS 28G MISC: 33 days supply | Qty: 100 | Fill #1

## 2016-09-30 MED FILL — ?METFORMIN HCL 1,000 MG TAB: 1000 | 30 days supply | Qty: 60 | Fill #1

## 2016-09-30 MED FILL — BD INSULIN SYR 1 ML 6MMX31G: 31G X 15/64 | 30 days supply | Qty: 100 | Fill #0

## 2016-10-01 ENCOUNTER — Ambulatory Visit: Payer: Self-pay | Admitting: Pharmacist

## 2016-10-16 MED FILL — ATORVASTATIN 20 MG TABLET: 20 | 30 days supply | Qty: 30 | Fill #1

## 2016-10-16 MED FILL — !NOVOLIN 70/30 100 UNITS/ML: (70-30) 100 | 13 days supply | Qty: 10 | Fill #3

## 2016-10-16 MED FILL — metFORMIN HCL 500 MG TABS: 500 | 30 days supply | Qty: 60 | Fill #2

## 2016-10-16 MED FILL — GABAPENTIN 300 MG CAPSULE: 300 | 30 days supply | Qty: 60 | Fill #1

## 2016-10-24 ENCOUNTER — Ambulatory Visit: Payer: Self-pay | Attending: Internal Medicine

## 2016-10-24 ENCOUNTER — Ambulatory Visit: Payer: Self-pay

## 2016-10-30 ENCOUNTER — Other Ambulatory Visit: Payer: Self-pay | Admitting: *Deleted

## 2016-10-30 MED ORDER — INSULIN NPH ISOPHANE & REGULAR (70-30) 100 UNIT/ML ~~LOC~~ SUSP: 30.0000 [IU] | Freq: Two times a day (BID) | SUBCUTANEOUS | 3 refills | Status: AC

## 2016-10-30 NOTE — Telephone Encounter (Signed)
PRINTED FOR PASS PROGRAM 

## 2016-11-06 MED FILL — ATORVASTATIN 20 MG TABLET: 20 | 30 days supply | Qty: 30 | Fill #2

## 2016-11-06 MED FILL — ?METFORMIN HCL 500MG TABLET: 500 | 30 days supply | Qty: 60 | Fill #3

## 2016-11-06 MED FILL — NOVOLIN 70/30 100 UNITS/ML: (70-30) 100 | 13 days supply | Qty: 10 | Fill #4

## 2016-11-06 MED FILL — GABAPENTIN 300 MG CAPSULE: 300 | 30 days supply | Qty: 60 | Fill #2

## 2016-11-10 ENCOUNTER — Ambulatory Visit: Payer: Self-pay | Attending: Family Medicine | Admitting: Family Medicine

## 2016-11-10 ENCOUNTER — Other Ambulatory Visit: Payer: Self-pay

## 2016-11-10 VITALS — BP 121/67 | HR 86 | Temp 97.6°F | Resp 18 | Ht 60.0 in | Wt 168.4 lb

## 2016-11-10 DIAGNOSIS — E1142 Type 2 diabetes mellitus with diabetic polyneuropathy: Secondary | ICD-10-CM | POA: Insufficient documentation

## 2016-11-10 DIAGNOSIS — E11319 Type 2 diabetes mellitus with unspecified diabetic retinopathy without macular edema: Secondary | ICD-10-CM | POA: Insufficient documentation

## 2016-11-10 DIAGNOSIS — E1165 Type 2 diabetes mellitus with hyperglycemia: Secondary | ICD-10-CM | POA: Insufficient documentation

## 2016-11-10 DIAGNOSIS — R809 Proteinuria, unspecified: Secondary | ICD-10-CM | POA: Insufficient documentation

## 2016-11-10 DIAGNOSIS — R079 Chest pain, unspecified: Secondary | ICD-10-CM | POA: Insufficient documentation

## 2016-11-10 DIAGNOSIS — R0789 Other chest pain: Secondary | ICD-10-CM

## 2016-11-10 DIAGNOSIS — Z794 Long term (current) use of insulin: Secondary | ICD-10-CM | POA: Insufficient documentation

## 2016-11-10 DIAGNOSIS — E782 Mixed hyperlipidemia: Secondary | ICD-10-CM

## 2016-11-10 DIAGNOSIS — R202 Paresthesia of skin: Secondary | ICD-10-CM | POA: Insufficient documentation

## 2016-11-10 DIAGNOSIS — R071 Chest pain on breathing: Secondary | ICD-10-CM

## 2016-11-10 LAB — POCT GLYCOSYLATED HEMOGLOBIN (HGB A1C): HEMOGLOBIN A1C: 7.5

## 2016-11-10 LAB — GLUCOSE, POCT (MANUAL RESULT ENTRY): POC Glucose: 189 mg/dl — AB (ref 70–99)

## 2016-11-10 MED ORDER — SITZ BATH MISC: 1.0000 | Freq: Every day | 0 refills | Status: DC | PRN

## 2016-11-10 MED ORDER — ATORVASTATIN CALCIUM 20 MG PO TABS: 20.0000 mg | ORAL_TABLET | Freq: Every day | ORAL | 0 refills | Status: AC

## 2016-11-10 MED ORDER — TRUE METRIX METER W/DEVICE KIT: PACK | 0 refills | Status: AC

## 2016-11-10 MED ORDER — SULFAMETHOXAZOLE-TRIMETHOPRIM 800-160 MG PO TABS: 1.0000 | ORAL_TABLET | Freq: Two times a day (BID) | ORAL | 0 refills | Status: DC

## 2016-11-10 MED ORDER — METHOCARBAMOL 500 MG PO TABS: 500.0000 mg | ORAL_TABLET | Freq: Three times a day (TID) | ORAL | 0 refills | Status: AC | PRN

## 2016-11-10 MED ORDER — GABAPENTIN 300 MG PO CAPS: 300.0000 mg | ORAL_CAPSULE | Freq: Three times a day (TID) | ORAL | 0 refills | Status: AC

## 2016-11-10 MED ORDER — ASPIRIN EC 81 MG PO TBEC: 81.0000 mg | DELAYED_RELEASE_TABLET | Freq: Every day | ORAL | 3 refills | Status: AC

## 2016-11-10 MED ORDER — IBUPROFEN 600 MG PO TABS: 600.0000 mg | ORAL_TABLET | Freq: Three times a day (TID) | ORAL | 0 refills | Status: AC | PRN

## 2016-11-10 NOTE — Progress Notes (Signed)
Subjective:  Patient ID: Rachel Dougherty, female    DOB: Feb 08, 1956  Age: 61 y.o. MRN: [id number]  CC: No chief complaint on file.   HPI [name] presents for    DM: Patient presents for follow up of diabetes. Symptoms: hyperglycemia, paresthesia of the feet and visual disturbances. Symptoms have stabilized. Patient denies foot ulcerations, hypoglycemia  and vomitting. Reports receiving opthalmologic interventions from ophthalmologist related to diabetes. Reports improvement of paresthesia symptoms with gabapentin use. Evaluation to date has been included: fasting lipid panel, hemoglobin A1C and microalbuminuria.  Home sugars: BGs range between 90 and 100's, BGs are high in the evening.BGs in the evening range 150-200's.  Treatment to date: Continued insulin which has been somewhat effective and Continued metformin which has been somewhat effective.   Chest wall pain: 4 weeks ago, worsened  1 week ago. Pain is intermittent and worsened with movement. Reports recent history of deep clean,  housecleaning.  Denies any history of heavy lifting. Denies any symptoms of heartburn or nausea.   Outpatient Medications Prior to Visit  Medication Sig Dispense Refill  . fluticasone (FLONASE) 50 MCG/ACT nasal spray 2 spray in each nostril daily (Patient not taking: Reported on 08/11/2016) 16 g 0  . glucose blood (TRUE METRIX BLOOD GLUCOSE TEST) test strip Use as instructed 100 each 12  . insulin NPH-regular Human (HUMULIN 70/30) (70-30) 100 UNIT/ML injection Inject 30-50 Units into the skin 2 (two) times daily with a meal. 50 units in the morning with meal and 30 units in the evening with meal. 90 mL 3  . insulin NPH-regular Human (NOVOLIN 70/30) (70-30) 100 UNIT/ML injection Take 50 units subcutaneous in the morning with meal and 30 units subcutaneous in the evening with meal 10 mL 11  . metFORMIN (GLUCOPHAGE) 1000 MG tablet Take 1 tablet (1,000 mg total) by mouth 2 (two)  times daily with a meal. Take as directed in your discharge instructions. 120 tablet 2  . nitrofurantoin, macrocrystal-monohydrate, (MACROBID) 100 MG capsule Take 1 capsule (100 mg total) by mouth 2 (two) times daily. 10 capsule 0  . pravastatin (PRAVACHOL) 20 MG tablet Take 20 mg by mouth every evening.      . TRUEPLUS LANCETS 28G MISC Use for checking blood sugar fasting and at meal time 100 each 11  . aspirin EC 81 MG tablet Take 1 tablet (81 mg total) by mouth daily. 30 tablet 2  . atorvastatin (LIPITOR) 20 MG tablet Take 1 tablet (20 mg total) by mouth daily. 30 tablet 2  . Blood Glucose Monitoring Suppl (TRUE METRIX METER) w/Device KIT Check blood sugar fasting and at meals and record 1 kit 0  . gabapentin (NEURONTIN) 300 MG capsule Take 1 capsule (300 mg total) by mouth 2 (two) times daily. 60 capsule 2   No facility-administered medications prior to visit.     ROS Review of Systems  Constitutional: Negative.   Eyes:       Diabetic retinopathy.  Respiratory: Negative.   Cardiovascular: Positive for chest pain.  Gastrointestinal: Negative.   Skin: Negative.   Neurological:       Feet paresthesias     Objective:  BP 121/67 (BP Location: Left Arm, Patient Position: Sitting, Cuff Size: Normal)   Pulse 86   Temp 97.6 F (36.4 C) (Oral)   Resp 18   Ht 5' (1.524 m)   Wt 168 lb 6.4 oz (76.4 kg)   LMP 05/21/2010   SpO2 98%   BMI 32.89  kg/m   BP/Weight 11/10/2016 08/11/2016 07/18/2016  Systolic BP 121 115 160  Diastolic BP 67 72 74  Wt. (Lbs) 168.4 163.2 156.6  BMI 32.89 31.87 30.58     Physical Exam  Constitutional: She appears well-developed and well-nourished.  Eyes: Conjunctivae are normal. Pupils are equal, round, and reactive to light.  Neck: No JVD present.  Cardiovascular: Normal rate, regular rhythm, normal heart sounds and intact distal pulses.   Pulmonary/Chest: Effort normal and breath sounds normal. She exhibits tenderness (lateral left ribcage).  Abdominal:  Soft. Bowel sounds are normal. There is tenderness (lateral LUQ at ribcage).  Skin: Skin is warm and dry.  Nursing note and vitals reviewed.   Assessment & Plan:   Problem List Items Addressed This Visit      Endocrine   Diabetic peripheral neuropathy associated with type 2 diabetes mellitus (HCC) - Primary   Bring glucometer and log to next office visit. To determine the adjustment needed for diabetic    medications.   Increase gabapentin dosage frequency from BID to TID. If symptoms don't improve may consider    switching to Lyrica.    Relevant Medications   Blood Glucose Monitoring Suppl (TRUE METRIX METER) w/Device KIT   gabapentin (NEURONTIN) 300 MG capsule   aspirin EC 81 MG tablet   atorvastatin (LIPITOR) 20 MG tablet   methocarbamol (ROBAXIN) 500 MG tablet   Other Relevant Orders   POCT glycosylated hemoglobin (Hb A1C) (Completed)   Glucose (CBG) (Completed)   Lipid Panel   Hepatic Function Panel    Other Visit Diagnoses    Mixed hyperlipidemia       Relevant Medications   aspirin EC 81 MG tablet   atorvastatin (LIPITOR) 20 MG tablet   Costochondral chest pain       Normal EKG. Suspect chest wall pain is musculoskeletal in nature. Anti-inflammatory and    muscle relaxer prescribed.   Relevant Medications   methocarbamol (ROBAXIN) 500 MG tablet   ibuprofen (ADVIL,MOTRIN) 600 MG tablet   Other Relevant Orders   EKG 12-Lead   Basic Metabolic Panel   CBC with Differential      Meds ordered this encounter  Medications  . DISCONTD: sulfamethoxazole-trimethoprim (BACTRIM DS) 800-160 MG tablet    Sig: Take 1 tablet by mouth 2 (two) times daily.    Dispense:  14 tablet    Refill:  0    Order Specific Question:   Supervising Provider    Answer:   Quentin Angst [id number]  . DISCONTD: Misc. Devices (SITZ BATH) MISC    Sig: 1 kit by Does not apply route daily as needed.    Dispense:  1 each    Refill:  0    Order Specific Question:   Supervising Provider     Answer:   Quentin Angst [id number]  . Blood Glucose Monitoring Suppl (TRUE METRIX METER) w/Device KIT    Sig: Check blood sugar fasting and at meals and record    Dispense:  1 kit    Refill:  0    Order Specific Question:   Supervising Provider    Answer:   Quentin Angst [id number]  . gabapentin (NEURONTIN) 300 MG capsule    Sig: Take 1 capsule (300 mg total) by mouth 3 (three) times daily.    Dispense:  90 capsule    Refill:  0    Order Specific Question:   Supervising Provider    Answer:   Quentin Angst [id number]  .  aspirin EC 81 MG tablet    Sig: Take 1 tablet (81 mg total) by mouth daily.    Dispense:  90 tablet    Refill:  3    Order Specific Question:   Supervising Provider    Answer:   Tresa Garter [id number]  . atorvastatin (LIPITOR) 20 MG tablet    Sig: Take 1 tablet (20 mg total) by mouth daily.    Dispense:  90 tablet    Refill:  0    Order Specific Question:   Supervising Provider    Answer:   Tresa Garter [id number]  . methocarbamol (ROBAXIN) 500 MG tablet    Sig: Take 1 tablet (500 mg total) by mouth every 8 (eight) hours as needed for muscle spasms.    Dispense:  30 tablet    Refill:  0    Order Specific Question:   Supervising Provider    Answer:   Tresa Garter [id number]  . ibuprofen (ADVIL,MOTRIN) 600 MG tablet    Sig: Take 1 tablet (600 mg total) by mouth every 8 (eight) hours as needed.    Dispense:  40 tablet    Refill:  0    Order Specific Question:   Supervising Provider    Answer:   Tresa Garter [[id number]]    Follow-up: Return in about 1 month (around 12/10/2016) for Diabetes.   Alfonse Spruce FNP

## 2016-11-10 NOTE — Progress Notes (Signed)
Patient is here for f/up  Patient complains about left front & back shoulder  Patient also complains about numb both feet

## 2016-11-10 NOTE — Patient Instructions (Addendum)
Bring glucometer and blood glucose log to next office visit.   Costochondritis Costochondritis is swelling and irritation (inflammation) of the tissue (cartilage) that connects your ribs to your breastbone (sternum). This causes pain in the front of your chest. Usually, the pain:  Starts gradually.  Is in more than one rib. This condition usually goes away on its own over time. Follow these instructions at home:  Do not do anything that makes your pain worse.  If directed, put ice on the painful area:  Put ice in a plastic bag.  Place a towel between your skin and the bag.  Leave the ice on for 20 minutes, 2-3 times a day.  If directed, put heat on the affected area as often as told by your doctor. Use the heat source that your doctor tells you to use, such as a moist heat pack or a heating pad.  Place a towel between your skin and the heat source.  Leave the heat on for 20-30 minutes.  Take off the heat if your skin turns bright red. This is very important if you cannot feel pain, heat, or cold. You may have a greater risk of getting burned.  Take over-the-counter and prescription medicines only as told by your doctor.  Return to your normal activities as told by your doctor. Ask your doctor what activities are safe for you.  Keep all follow-up visits as told by your doctor. This is important. Contact a doctor if:  You have chills or a fever.  Your pain does not go away or it gets worse.  You have a cough that does not go away. Get help right away if:  You are short of breath. This information is not intended to replace advice given to you by your health care provider. Make sure you discuss any questions you have with your health care provider. Document Released: 12/17/2007 Document Revised: 01/18/2016 Document Reviewed: 10/24/2015 Elsevier Interactive Patient Education  2017 [employer]  Diabetic Nephropathy Diabetic nephropathy is kidney disease that is caused  by diabetes (diabetes mellitus). Kidneys are organs that filter and clean blood and get rid of body waste products and extra fluid. Diabetes can cause gradual kidney damage over many years. Diabetic nephropathy that continues to get worse (progress) can lead to kidney failure. What are the causes? This condition is caused by kidney damage from diabetes that is not well controlled with treatment. Having high blood sugar (glucose) for a long time can damage blood vessels in the kidneys and cause them to thicken and scar. This prevents the kidneys from functioning normally. What increases the risk? This condition is more likely to develop in people with diabetes who:  Have had diabetes for many years.  Have high blood pressure.  Have high blood glucose levels over a long period of time.  Have a family history of kidney disease.  Have a history of tobacco use.  Have certain genes that are passed from parent to child (inherited).  Are of African-American, Hispanic, Native American, Asian, or [place] descent. What are the signs or symptoms? This condition may not cause symptoms at first. If you do have symptoms, they may include:  Swelling of your hands, feet, or ankles.  Weakness.  Poor appetite.  Nausea.  Confusion.  Fatigue.  Trouble sleeping.  Dry, itchy skin. If nephropathy leads to kidney failure, symptoms may include:  Vomiting.  Shortness of breath.  Jerky movements you cannot control (seizure).  Coma. How is this diagnosed? It  is important to diagnose this condition before symptoms develop. You may be screened for diabetic nephropathy at a routine health care visit. Screening tests may include:  Yearly (annual) urine tests.  Urine collection over a 24-hour period to measure kidney function.  Blood tests that measure blood glucose levels and kidney function. If your health care provider suspects diabetic nephropathy, he or she may:  Review your  medical history and symptoms.  Do a physical exam.  Do an ultrasound of your kidneys.  Perform a procedure to take a sample of kidney tissue for testing (biopsy). How is this treated? The goal of treatment is to prevent or slow down damage to your kidneys by managing your diabetes. To do this, it is important to control:  Your blood pressure.  Generally, the goal is to keep your blood pressure below 130/80. Your target blood pressure depends on many factors.  To help control blood pressure, you may be prescribed medicines to lower blood pressure (ACE inhibitors) or to help your body get rid of excess fluid (diuretics).  Your A1c (hemoglobin A1c) level. Generally, the goal of treatment is to maintain an A1c level of less than 7%.  Your blood glucose level.  Your blood lipids. If you have high cholesterol, you may need to take lipid-lowering drugs, such as statins. Other treatments may include:  Medicines, including insulin injections.  Lifestyle changes, such as:  Losing weight.  Quitting smoking (smoking cessation).  Changes to your diet, which may include:  Limiting your salt (sodium), protein, and fluid intake.  Taking vitamin D supplements. If your disease progresses to end-stage kidney failure, treatment may include:  Dialysis. This is a procedure to filter your blood with a machine.  Kidney transplant. Follow these instructions at home: Lifestyle   Maintain a healthy weight. Work with your health care provider to lose weight, if needed.  Do not use any products that contain nicotine or tobacco, such as cigarettes and e-cigarettes. If you need help quitting, ask your health care provider.  Be physically active every day. Ask your health care provider what type of exercise is best for you.  Eat healthy foods, and eat healthy snacks between meals. Follow instructions from your health care provider about eating and drinking restrictions.  Limit your sodium,  protein, or fluid intake as directed.  Work with your health care provider to manage your blood pressure. General instructions   Follow your diabetes management plan as directed.  Check your blood glucose levels as directed by your health care provider.  Keep your blood glucose in your target range as directed by your health care provider.  Have your A1c level checked at least two times a year, or as often as told by your health care provider.  Take over-the-counter and prescription medicines only as told by your health care provider. This includes insulin and supplements.  Keep all follow-up visits and routine visits as told by your health care provider. This is important. Make sure to get screening tests as directed. Contact a health care provider if:  You have trouble keeping your blood glucose in your goal range.  Your blood glucose level is higher than 240 mg/dL (13.3 mmol/L) for 2 days in a row.  You have swelling in your hands, ankles, or feet.  You feel weak, tired, or dizzy.  You have involuntary muscle tightening (spasms).  You have nausea or vomiting.  You feel tired all the time. Get help right away if:  You are very sleepy.  You have:  A seizure.  Severe, painful muscle spasms.  Shortness of breath.  Chest pain.  You faint. Summary  Keep your blood glucose in your target range as directed by your health care provider.  Work with your health care provider to manage your blood pressure.  Keep all follow-up visits and routine visits as told by your health care provider. This is important. Make sure to get screening tests as directed. This information is not intended to replace advice given to you by your health care provider. Make sure you discuss any questions you have with your health care provider. Document Released: 07/20/2007 Document Revised: 05/28/2016 Document Reviewed: 05/28/2016 Elsevier Interactive Patient Education  2017 [employer].

## 2016-11-12 MED FILL — TRUE METRIX TEST STRIP: 30 days supply | Qty: 100 | Fill #3

## 2016-11-12 MED FILL — METHOCARBAMOL 500 MG TABLET: 500 | 10 days supply | Qty: 30 | Fill #0

## 2016-11-12 MED FILL — SULFAMETHOXAZOLE/TMP DS TAB: 800-160 | 7 days supply | Qty: 14 | Fill #0

## 2016-11-12 MED FILL — IBUPROFEN 600 MG TABLET: 600 | 13 days supply | Qty: 40 | Fill #0

## 2016-11-13 MED FILL — ATORVASTATIN 20 MG TABLET: 20 | 30 days supply | Qty: 30 | Fill #0

## 2016-11-13 MED FILL — !TRUE METRIX BLOOD GLUCOSE: 1 days supply | Qty: 1 | Fill #0

## 2016-11-13 MED FILL — GABAPENTIN 300 MG CAPSULE: 300 | 30 days supply | Qty: 90 | Fill #0

## 2018-12-16 IMAGING — CR DG CHEST 2V
2 series · 2 of 2 positions shown · non-contrast
Comparison: None.

CLINICAL DATA: Lower chest wall pain, diabetes

EXAM:
CHEST  2 VIEW

[chest pa]
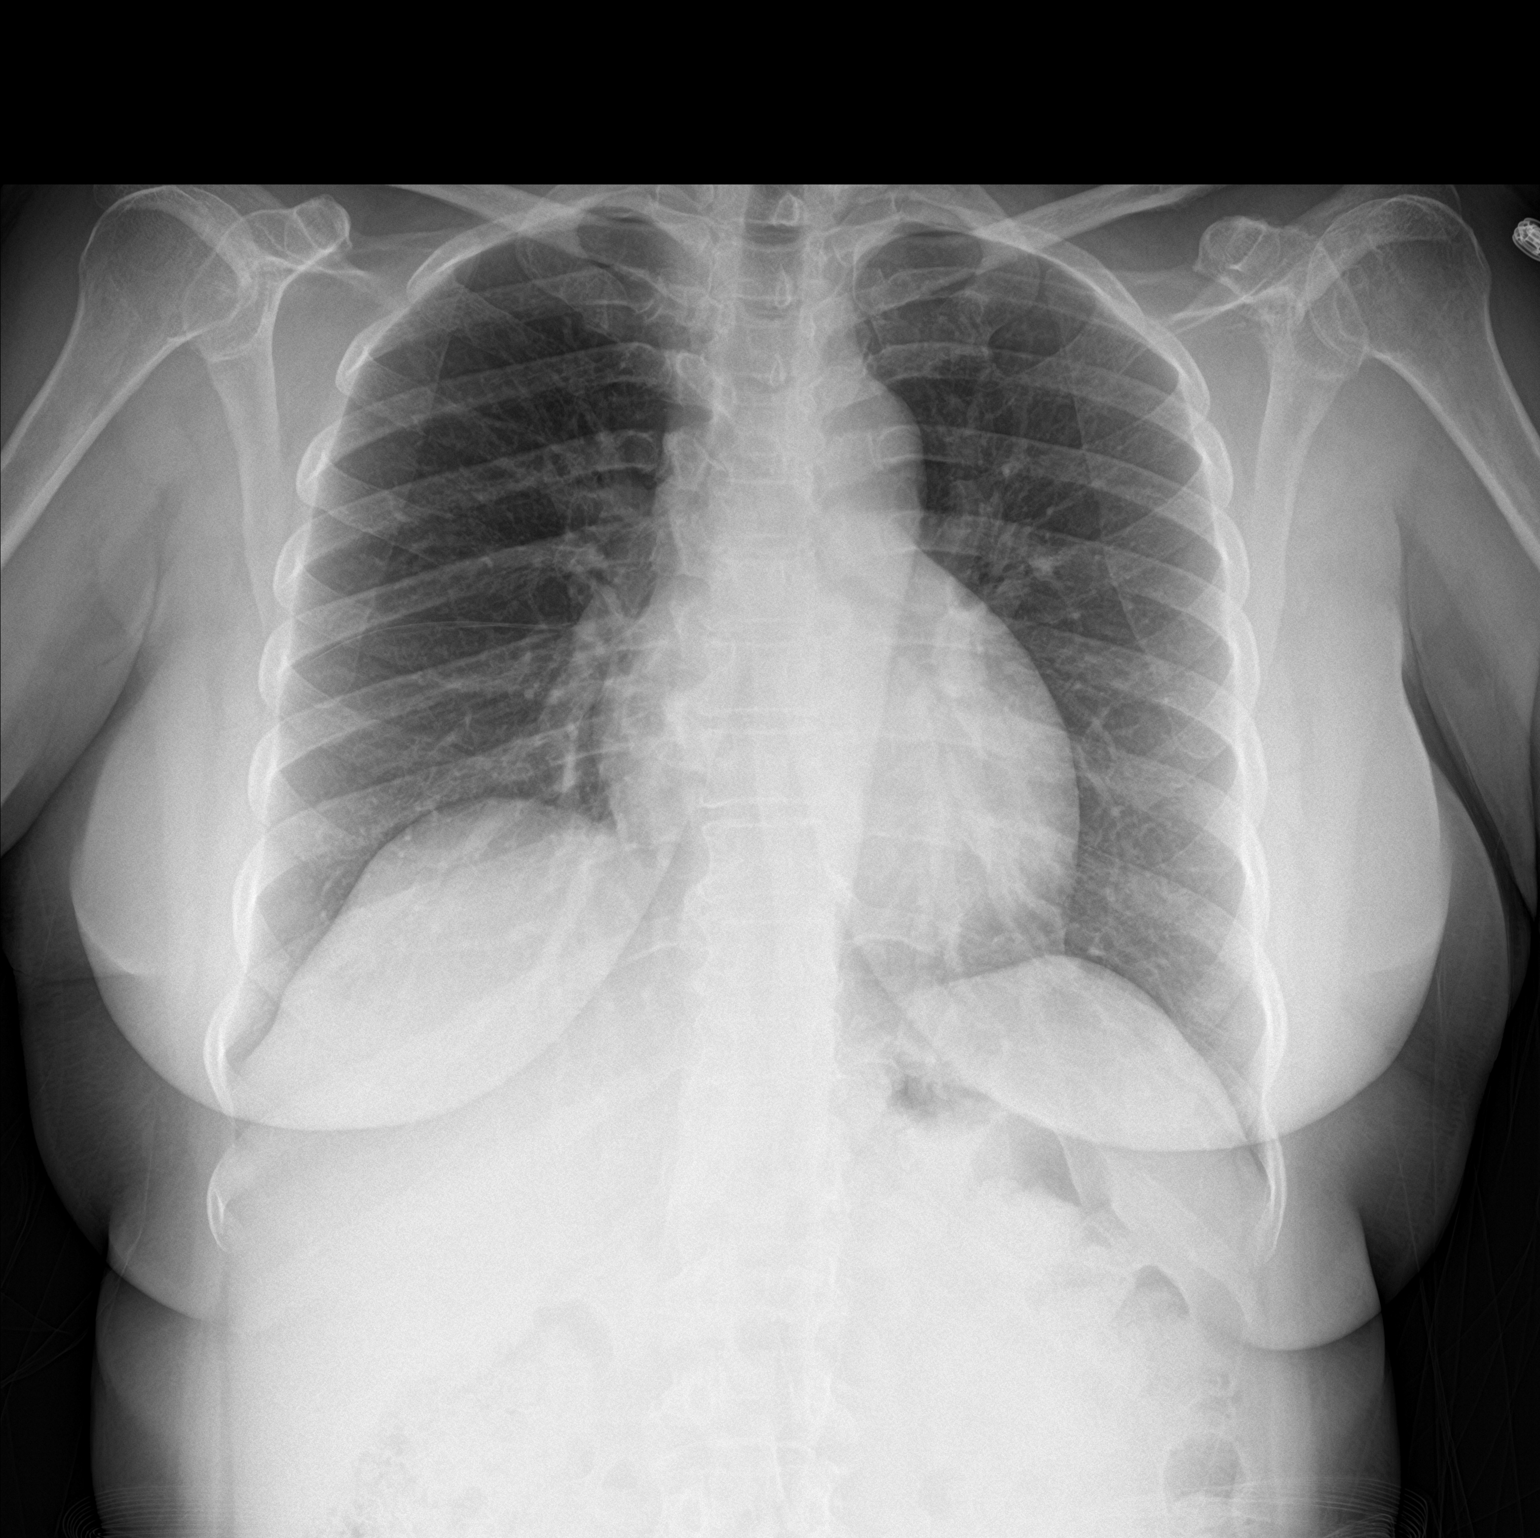

[chest lat]
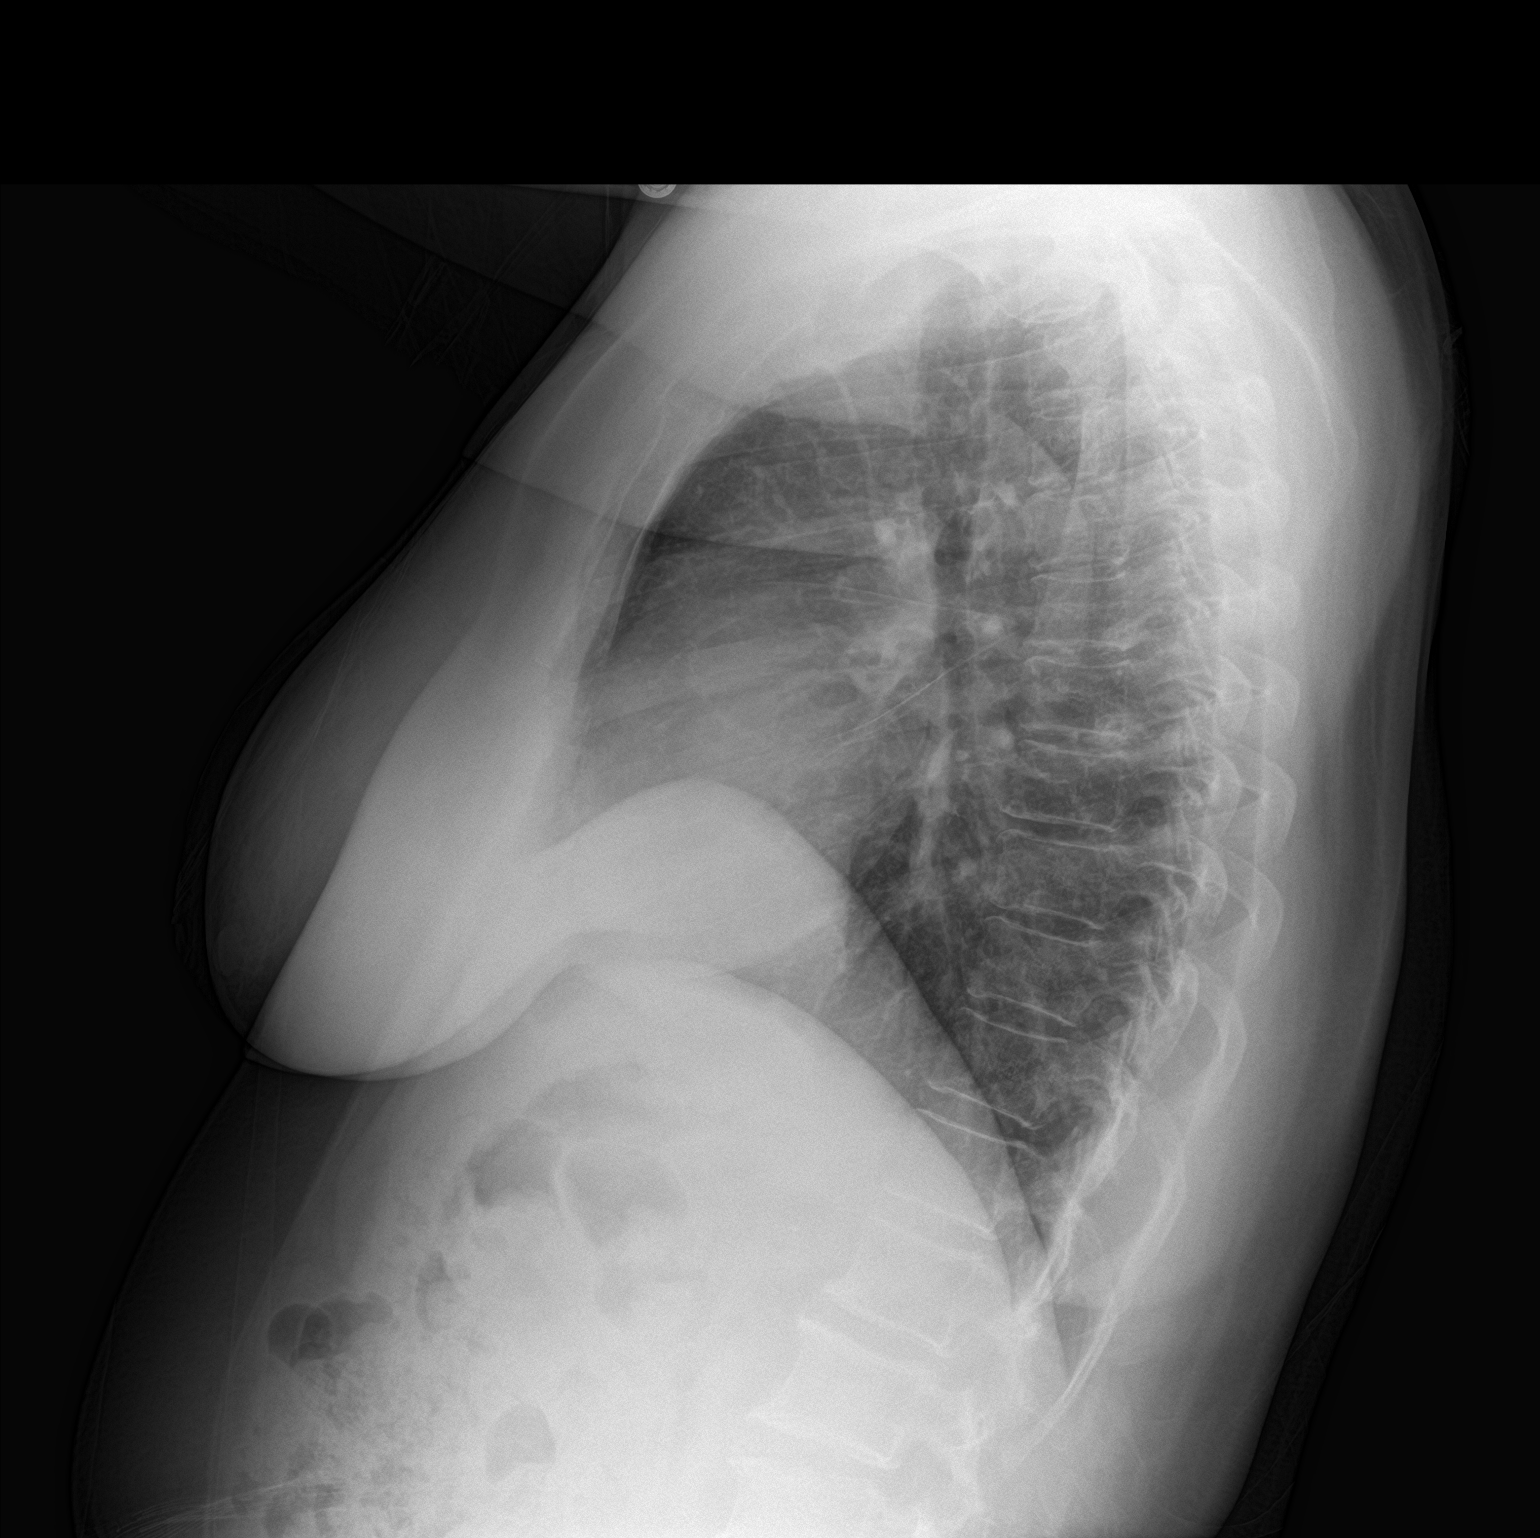

[2 of 2 positions shown; findings below may reference images not displayed]

FINDINGS: The heart size and mediastinal contours are within normal limits.
Both lungs are clear. The visualized skeletal structures are
unremarkable.
IMPRESSION: No active cardiopulmonary disease.
